# Patient Record
Sex: Female | Born: 1955 | Race: Black or African American | Hispanic: No | Marital: Married | Smoking: Never smoker
Health system: Southern US, Community
[De-identification: ages and names within clinical notes are randomized; demographics above are authoritative.]

## PROBLEM LIST (undated history)

## (undated) DIAGNOSIS — I10 Essential (primary) hypertension: Secondary | ICD-10-CM

## (undated) DIAGNOSIS — I38 Endocarditis, valve unspecified: Secondary | ICD-10-CM

## (undated) DIAGNOSIS — E119 Type 2 diabetes mellitus without complications: Secondary | ICD-10-CM

## (undated) HISTORY — PX: APPENDECTOMY: SHX54

## (undated) HISTORY — PX: BREAST LUMPECTOMY: SHX2

## (undated) HISTORY — PX: ABDOMINAL HYSTERECTOMY: SHX81

## (undated) HISTORY — PX: TUBAL LIGATION: SHX77

---

## 2009-05-14 ENCOUNTER — Observation Stay (HOSPITAL_COMMUNITY): Admission: EM | Admit: 2009-05-14 | Discharge: 2009-05-17 | Payer: Self-pay | Admitting: Emergency Medicine

## 2009-05-14 ENCOUNTER — Ambulatory Visit: Payer: Self-pay | Admitting: Cardiology

## 2009-05-15 ENCOUNTER — Encounter: Payer: Self-pay | Admitting: Cardiology

## 2011-01-13 LAB — BASIC METABOLIC PANEL
Calcium: 9.5 mg/dL (ref 8.4–10.5)
Chloride: 106 mEq/L (ref 96–112)
Creatinine, Ser: 0.91 mg/dL (ref 0.4–1.2)
GFR calc Af Amer: >60 mL/min (ref >60)
GFR calc Af Amer: >60 mL/min (ref >60)
GFR calc non Af Amer: >60 mL/min (ref >60)
GFR calc non Af Amer: >60 mL/min (ref >60)
Potassium: 3.7 mEq/L (ref 3.5–5.1)
Sodium: 139 mEq/L (ref 135–145)

## 2011-01-13 LAB — LIPID PANEL
LDL Cholesterol: 44 mg/dL (ref 0–99)
LDL Cholesterol: 50 mg/dL (ref 0–99)
Total CHOL/HDL Ratio: 2 RATIO
Triglycerides: 67 mg/dL (ref <150)
VLDL: 12 mg/dL (ref 0–40)
VLDL: 13 mg/dL (ref 0–40)

## 2011-01-13 LAB — BLOOD GAS, ARTERIAL
Acid-Base Excess: 3.5 mmol/L — ABNORMAL HIGH (ref 0.0–2.0)
pCO2 arterial: 43.8 mmHg (ref 35.0–45.0)
pO2, Arterial: 71.6 mmHg — ABNORMAL LOW (ref 80.0–100.0)

## 2011-01-13 LAB — CK TOTAL AND CKMB (NOT AT ARMC)
CK, MB: 4 ng/mL (ref 0.3–4.0)
Relative Index: 1.3 (ref 0.0–2.5)

## 2011-01-13 LAB — GLUCOSE, CAPILLARY
Glucose-Capillary: 133 mg/dL — ABNORMAL HIGH (ref 70–99)
Glucose-Capillary: 161 mg/dL — ABNORMAL HIGH (ref 70–99)
Glucose-Capillary: 162 mg/dL — ABNORMAL HIGH (ref 70–99)
Glucose-Capillary: 167 mg/dL — ABNORMAL HIGH (ref 70–99)
Glucose-Capillary: 181 mg/dL — ABNORMAL HIGH (ref 70–99)

## 2011-01-13 LAB — CARDIAC PANEL(CRET KIN+CKTOT+MB+TROPI)
CK, MB: 2.8 ng/mL (ref 0.3–4.0)
Relative Index: 1.3 (ref 0.0–2.5)
Relative Index: 1.3 (ref 0.0–2.5)
Total CK: 218 U/L — ABNORMAL HIGH (ref 7–177)
Troponin I: 0.02 ng/mL (ref 0.00–0.06)
Troponin I: 0.02 ng/mL (ref 0.00–0.06)

## 2011-01-13 LAB — HEPATIC FUNCTION PANEL
ALT: 33 U/L (ref 0–35)
AST: 21 U/L (ref 0–37)
Bilirubin, Direct: 0.2 mg/dL (ref 0.0–0.3)
Indirect Bilirubin: 0.6 mg/dL (ref 0.3–0.9)
Total Bilirubin: 0.8 mg/dL (ref 0.3–1.2)

## 2011-01-13 LAB — CBC
HCT: 31.8 % — ABNORMAL LOW (ref 36.0–46.0)
MCHC: 34.3 g/dL (ref 30.0–36.0)
MCV: 90 fL (ref 78.0–100.0)
RBC: 3.53 MIL/uL — ABNORMAL LOW (ref 3.87–5.11)
RBC: 3.88 MIL/uL (ref 3.87–5.11)
WBC: 4.9 10*3/uL (ref 4.0–10.5)
WBC: 5.9 10*3/uL (ref 4.0–10.5)

## 2011-01-13 LAB — IRON AND TIBC
Iron: 84 ug/dL (ref 42–135)
TIBC: 360 ug/dL (ref 250–470)

## 2011-01-13 LAB — POCT CARDIAC MARKERS
CKMB, poc: 1.5 ng/mL (ref 1.0–8.0)
CKMB, poc: 1.8 ng/mL (ref 1.0–8.0)
Troponin i, poc: <0.05 ng/mL (ref 0.00–0.09)
Troponin i, poc: <0.05 ng/mL (ref 0.00–0.09)

## 2011-01-13 LAB — RETICULOCYTES
Retic Count, Absolute: 80.5 10*3/uL (ref 19.0–186.0)
Retic Ct Pct: 2.3 % (ref 0.4–3.1)

## 2011-01-13 LAB — DIFFERENTIAL
Basophils Relative: 0 % (ref 0–1)
Eosinophils Absolute: 0.1 10*3/uL (ref 0.0–0.7)
Eosinophils Relative: 2 % (ref 0–5)
Lymphocytes Relative: 28 % (ref 12–46)
Lymphs Abs: 1.7 10*3/uL (ref 0.7–4.0)
Lymphs Abs: 1.8 10*3/uL (ref 0.7–4.0)
Monocytes Relative: 5 % (ref 3–12)
Monocytes Relative: 6 % (ref 3–12)
Neutro Abs: 2.6 10*3/uL (ref 1.7–7.7)
Neutrophils Relative %: 55 % (ref 43–77)

## 2011-01-13 LAB — FOLATE: Folate: 11.5 ng/mL

## 2011-01-13 LAB — FERRITIN: Ferritin: 58 ng/mL (ref 10–291)

## 2011-01-13 LAB — TSH: TSH: 0.948 u[IU]/mL (ref 0.350–4.500)

## 2011-01-13 LAB — BRAIN NATRIURETIC PEPTIDE: Pro B Natriuretic peptide (BNP): 81.2 pg/mL (ref 0.0–100.0)

## 2011-01-13 LAB — HEMOGLOBIN A1C: Hgb A1c MFr Bld: 6.6 % — ABNORMAL HIGH (ref 4.6–6.1)

## 2011-02-19 NOTE — Group Therapy Note (Signed)
NAMEOLEVA, Pamela [name]                   ACCOUNT NO.:  [id number]   MEDICAL RECORD NO.:  [id number]          PATIENT TYPE:  OBV   LOCATION:  A310                          FACILITY:  [facility]   PHYSICIAN:  Melissa L. Ladona Ridgel, MD  DATE OF BIRTH:  10/16/1955   DATE OF PROCEDURE:  05/16/2009  DATE OF DISCHARGE:                                 PROGRESS NOTE   CHIEF COMPLAINT:  Chest pain.   SUBJECTIVE:  The patient reports a small episode of dizziness  experienced immediately after arterial blood gas was drawn today, but  other than that she describes no chest pain, no headache, no other  dizziness, no palpitations.  No nausea, vomiting.   VITAL SIGNS:  Today are temperature 97.7, pulse 60, respirations 19,  blood pressure is 140/71.   She had an ABG drawn earlier that showed a pH of 7.418.  Her CBC was  reviewed, her B Met was reviewed; both were essentially within normal  limits other than a glucose of 144.   PHYSICAL EXAM:  GENERAL:  The patient is alert, oriented, seems to have  a little bit more energy today than she did yesterday.  She is overall a  55 year old African American female lying comfortably in her bed in no  acute distress.  HEENT:  Her head is normocephalic, atraumatic.  Eyes are anicteric with  pupils that are equal, round, reactive to light.  Nose shows no external  lesions.  No nasal discharge.  Mouth shows no external lesions.  She has  moist mucous membranes.  NECK:  Supple.  Her trachea is midline.  Her neck demonstrates no  lymphadenopathy or JVD.  CHEST:  No accessory muscle usage.  LUNGS:  Clear to auscultation with slightly decreased breath sounds, but  no wheezes or crackles are appreciated.  HEART:  Regular rate and rhythm with a pulse of approximately 60.  No  murmurs, rubs, or gallops or appreciated.  She has 2+ radial pulses  bilaterally.  EXTREMITIES:  Her upper extremities are warm to the touch.  Her lower  extremities demonstrate no edema.  She has 2+  dorsalis pedis pulses  bilaterally.  She has no signs of cyanosis in her distal extremities.  ABDOMEN:  Soft, nontender, nondistended with good bowel sounds.  SKIN:  Shows no signs of new bruising, lesions or rash.   ASSESSMENT:  Pamela [name] is a 55 year old African American female who is  obese, admitted with chest pain that has resolved.  The rest of her  comorbidities including diabetes, mitral regurgitation, hypertension and  normocytic anemia have been stable on home medications.  Indeed, with  regards to her anemia her anemia panel demonstrates that she is not iron  deficient.   PLAN:  Will await finalized results of the cardiac stress test and  pulmonary function tests with any further recommendations from Dr.  Dietrich Pates as well as Dr. Juanetta Gosling regarding the patient's condition.  Hopefully she will be ready for discharge tomorrow.      Stephani Police, PA      Melissa L. Ladona Ridgel, MD  Electronically Signed    MLY/MEDQ  D:  05/16/2009  T:  05/16/2009  Job:  [id number]

## 2011-02-19 NOTE — Group Therapy Note (Signed)
Pamela [name], Pamela [name]                   ACCOUNT NO.:  [id number]   MEDICAL RECORD NO.:  [id number]          PATIENT TYPE:  OBV   LOCATION:  A310                          FACILITY:  [facility]   PHYSICIAN:  Melissa L. Ladona Ridgel, MD  DATE OF BIRTH:  1956-05-09   DATE OF PROCEDURE:  05/15/2009  DATE OF DISCHARGE:                                 PROGRESS NOTE   CHIEF COMPLAINT:  Chest pain.   SUBJECTIVE:  The patient reports no chest pain currently, but she did  have some overnight.  She does report orthopnea and that she had to prop  her head up on three pillows in order to breath easier.  She is having  bowel movements and urinating without difficulty.  She is coughing, but  does not bring up any sputum.  She denies any aches or pains.   OBJECTIVE:  VITAL SIGNS:  Temperature 96.9, pulse 44, respirations 20,  blood pressure is 161/84.  EKG:  Normal sinus rhythm with bradycardia in the 40s.  She has no Q-  waves, no ST elevation.  Labs are significant today for a CK of 218,  troponin 0.02.  GENERAL:  The patient is a 55 year old African American obese female who  is sitting comfortably in bed, in no apparent distress.  HEENT:  Head is atraumatic, normocephalic.  Eyes are anicteric.  Pupils  are equal round react to light.  Conjunctivae are pink.  Nose shows no  external lesions or nasal discharge.  Mouth has moist mucous membranes  with no external lesions.  NECK:  Supple.  Trachea midline.  She has no appreciable JVD or carotid  bruits.  CHEST:  No accessory muscle usage.  LUNGS:  Decreased breath sounds, particularly on the left, but no  obvious wheezes or crackles or rales.  HEART:  Regular rate and rhythm.  She has a known history of mitral  regurgitation, but I am unable to hear this.  She has no appreciable  murmurs, rubs or gallops on my auscultation.  She has 2+ radial pulses  bilaterally and 2+ dorsalis pedis pulses bilaterally.  EXTREMITIES:  Show no edema or cyanosis.  ABDOMEN:  Soft,  nontender, nondistended with good bowel sounds.  PSYCHIATRIC:  The patient is alert, oriented and pleasant to speak with.  Her short-term memory is intact.  She displays no evidence of depression  and does not report any suicidal thoughts.   ASSESSMENT:  1. Chest pain.  2. Normocytic anemia.  3. Hypertension.  4. Mitral regurgitation.  5. Diabetes.   PLAN:  1. Chest pain, mitral regurgitation, hypertension, all being followed      by Cardiology.  They have added lisinopril today and decreased her      metoprolol per the nurse practitioner.  She will likely need a      cardiac cath.  2. For normocytic anemia.  There are no frank signs of bleeding.  We      will check guaiacs.  3. Type 2 diabetes.  Stable, currently on Actos and insulin.  4. Arthritis.  The patient stable on Skelaxin.  Addendum:  I have seen and evaluated this patient .  I concur with the  above physical exam.  I reveiwed the patient's CT scan with the patient.  There was no evidence for PE or pneumonia.  She does have a small left  pleural effusion of unclear significance.  I have reviewed the case with  Dr. Dietrich Pates and the patient will be taken for stress testing in the AM.  Melisa L. Ladona Ridgel, MD      Stephani Police, PA      Melissa L. Ladona Ridgel, MD  Electronically Signed    MLY/MEDQ  D:  05/15/2009  T:  05/15/2009  Job:  [id number]

## 2011-02-19 NOTE — H&P (Signed)
[name], [name] NO.:  [id number]   MEDICAL RECORD NO.:  [id number]          PATIENT TYPE:  EMS   LOCATION:  ED                            FACILITY:  [facility]   PHYSICIAN:  Pamela Shipper, MD     DATE OF BIRTH:  [birth year]   DATE OF ADMISSION:  05/14/2009  DATE OF DISCHARGE:  LH                              HISTORY & PHYSICAL   The patient's PMD is Dr. Renaldo [name] in [place], [place].  Her  cardiologist is Dr. Earna [name].   ADMISSION DIAGNOSES:  1. Chest pain.  2. History of type 2 diabetes.  3. Hypertension.  4. Mitral regurgitation.  5. Arthritis.  6. History of stroke.   CHIEF COMPLAINT:  Chest pain since this morning.   HISTORY OF PRESENT ILLNESS:  The patient is a 55 year old African  American female who has a past medical history of diabetes, hypertension  who was walking across the parking lot of her church when she started  experiencing pain in the center part of the chest.  Pain was described  as sharp pain associated with shortness of breath, dizziness.  She felt  like her heart was beating fast.  She denies any nausea.  She started  sweating profusely denies any syncopal episode.  Denies radiation of the  pain.  She rested but it made the pain no better.  The pain seemed to  increase with deep breathing.  Pain was 8/10 in intensity.  She took one  of her sublingual nitroglycerin and experienced some relief of the pain.  Then EMS gave her another sublingual nitroglycerin and she had some more  relief.  However, the pain has not gone away completely.  The pain is  currently about 5/10 intensity.  She had similar pain back in July of  this year when she was climbing stairs and that pain resolved  spontaneously.  She tells me that she has had at least two stress tests,  the last one was done about a year ago which was negative.  She has  never had a cardiac cath.  No other aggravating or relieving factors  identified.  No other precipitant factors  identified.   MEDICATIONS AT HOME:  1. Meclizine 25 mg three times a day.  2. Actos 30 mg daily.  3. Simvastatin 40 mg once daily.  4. Benicar HCT 40/25 once daily.  5. Metoprolol (I think this is extended release) 200 mg once daily.  6. Metformin 1000 mg every morning and 500 mg at bedtime.  7. Aspirin 81 mg daily.  8. Metaxalone 800 mg q.6 h. as needed.  9. Nitroglycerin sublingually as needed.  10.Zantac as needed.   ALLERGIES:  Penicillin; reaction is unknown.   PAST MEDICAL HISTORY:  Positive for stroke in 2007 which affected the  left side of her face, history of diabetes, mitral regurgitation,  hypertension, arthritis in her knee and arm.   SURGICAL HISTORY:  Includes appendectomy, benign breast lump removal.   SOCIAL HISTORY:  Lives in [place] with her husband.  She works in  Public affairs consultant.  Denies smoking, alcohol, or illicit drug use.  Independent with daily activities otherwise.   FAMILY HISTORY:  Positive for diabetes, arthritis.  No history of heart  disease.   REVIEW OF SYSTEMS:  GENERAL:  System positive for weakness.  HEENT:  Unremarkable.  CARDIOVASCULAR:  As in HPI.  RESPIRATORY:  As in HPI.  Denies any fever or cough.  GI:  Unremarkable.  GU:  Unremarkable.  NEUROLOGICAL:  Unremarkable.  PSYCHIATRIC:  Unremarkable.  DERMATOLOGICAL:  Unremarkable.  MUSCULOSKELETAL:  Unremarkable. OTHER  SYSTEMS:  Unremarkable.   PHYSICAL EXAMINATION:  VITAL SIGNS:  Temperature has not been recorded.  Blood pressure was initially 164/91, last reading is 156/81, heart rate  in the 40s occasionally, respiratory rate 20 breaths per minute,  saturation 100% on 2 L nasal cannula.  GENERAL:  Exam is obese African American female in no distress.  HEENT:  Head is normocephalic, atraumatic.  Oral mucous membranes moist.  No pallor, no icterus.  NECK:  Soft and supple.  No thyromegaly is appreciated.  No  lymphadenopathy is noted.  LUNGS:  Clear to auscultation  bilaterally.  No wheezing, rales, or  rhonchi.  No dullness to percussion.  CARDIOVASCULAR SYSTEM:  S1, S2 normal, regular.  I do not appreciate any  murmurs.  No S3, S4.  No rubs or bruits.  Slight pedal edema, not  significant.  Peripheral pulses are poor but palpable.  ABDOMEN:  Obese, nontender, nondistended.  Bowel sounds are present.  No  masses or organomegaly is appreciated.  GU:  Examination deferred at this time.  MUSCULOSKELETAL:  Exam no obvious joint swelling is noted.  Legs appear  to be enlarged, however, it could be her body habitus.  NEUROLOGICALLY:  She is alert, oriented x3.  No focal neurological  deficits are present.  DERMATOLOGICAL:  Exam does not reveal any rashes.   LAB DATA:  Her CBC shows a hemoglobin of 10.6, MCV 90, white cell count  is normal, platelet count is normal.  D-dimer is 0.58.  Glucose 137, BUN  and creatinine are normal.  Cardiac enzymes negative x2.  EKG shows a  sinus rhythm at a rate of 68, normal axis, intervals appear to be in the  normal range.  No definite Q waves are present.  No concerning ST- or T-  wave changes are noted.   Chest x-ray was done which shows borderline heart size with pulmonary  vascular congestion without any acute changes otherwise.   ASSESSMENT:  This is a 55 year old African American female who presents  with chest pain in the retrosternal area described as a sharp pain  increasing with deep breathing.  Her D-dimer is mildly elevated.  Differential diagnosis includes pulmonary embolism, coronary artery  disease, gastroesophageal reflux disease.  She does have risk factors  for CAD including diabetes, hypertension, and obesity.  1. Chest pain.  We have been unable to get a CT because her IV access      is very poor.  We will obtain a PICC line tomorrow and get a CT      angio at that time.  EKG will be ,repeated lipid panel will be      checked.  Cardiac enzymes will be cycled.  Cardiology consultation      will be  obtained.  2. Anemia.  We will check anemia panel.  3. Diabetes.  Continue with Actos, hold off on metformin for now.      Sliding scale will be provided.  HbA1c will be  checked.  4. Hypertension.  Continue with Benicar.  Her heart rate here      occasionally is in the 40s.  I will hold metoprolol today and start      her tomorrow on a b.i.d. dosing.  5. Obesity.  Weight loss counseling provided.  6. Venous Doppler of the lower extremities will be checked.  7. Records will be obtained from Dr. Albertina Senegal office.  8. LFTs will be checked as well.   Further management decisions will depend on results of further testing  and patient's response to treatment.      Pamela Shipper, MD  Electronically Signed     GK/MEDQ  D:  05/14/2009  T:  05/14/2009  Job:  [id number]   cc:   Dr. Clent Ridges, [place]   Dr. Renaldo [name]  [place], [place]

## 2011-02-19 NOTE — Consult Note (Signed)
NAMESKYLEY, GRANDMAISON NO.:  [id number]   MEDICAL RECORD NO.:  [id number]          PATIENT TYPE:  OBV   LOCATION:  A310                          FACILITY:  [facility]   PHYSICIAN:  Gerrit Friends. Dietrich Pates, MD, FACCDATE OF BIRTH:  23-Sep-1956   DATE OF CONSULTATION:  05/15/2009  DATE OF DISCHARGE:                                 CONSULTATION   PRIMARY CARDIOLOGIST:  Dr. Earna Coder in [place].   PRIMARY CARE PHYSICIAN:  Dr. Renaldo Harrison in [place]   REASON FOR CONSULTATION:  Chest pain.   HISTORY OF PRESENT ILLNESS:  A 55 year old, obese African American  female with known history of diabetes, mitral regurgitation,  hypertension, and CVA, who normally sees cardiologist in [place], Dr.  Earna Coder (the patient last saw him in March 2010.)  The patient is being  treated for mitral valve regurgitation.  She did have a stress test and  echocardiogram little over a year ago.  Records have been requested.   The patient will on her way to church while driving noticed some  tightness in her chest and shortness of breath on arriving to church and  walking from the parking lot to inside the church.  She had some dyspnea  on exertion, wheezing, and began to have some chest pain which she  described as sharp, constant, and midsternal with some radiation to the  left shoulder, however, she is complaining of some left shoulder  arthritis last week.  The patient states that the pain lasts  approximately 1 hour while sitting in church, the shortness of breath  continues with some chest discomfort.  Her son, who was playing in  instrument in the church prior to the service noticed her gasping for  breath and she began to feel dizzy.  He brought her to [facility]  emergency room.   The patient is a Tree surgeon at [employer]  and she states approximately 3 weeks ago while getting her TB test  completed and seeing the hospital nurse there.  She was having some  shortness of breath that was noticed by the hospital nurse.  The patient  admitted having some shortness of breath which has been progressive over  the last 3 weeks.  Occasional chest tightness.  She did not seek medical  attention for this.  The patient is caretaker for 2 elderly parents and  has been focusing on their care and has been diminishing her own.   On arrival to [facility] emergency room, the patient was given 4 baby  aspirin, Zofran, and nitroglycerin and felt some better.  Her blood  pressure on arrival was found to be elevated at 164/91 with a heart rate  of 61.  The patient was placed on oxygen and her saturation was 97%.  Review of oxygen sat off oxygen reveals an O2 sat of 89% on room air.  Currently, the patient is without complaints and resting comfortably in  bed without oxygen.  She is not short of breath and not complaining of  discomfort in her chest at this time.   REVIEW  OF SYSTEMS:  Vertigo, chest pain, shortness of breath, dyspnea on  exertion, lower extremity edema, right knee and shoulder and back pain,  right shoulder pain.  She has been ongoing and she is in physical  therapy for this.  All other systems are reviewed and found to be  negative.   PAST MEDICAL HISTORY:  Type 2 diabetes, hypertension, mitral  regurgitation, arthritis of the right knee and right arm, CVA in 2007,  and obesity.   PAST SURGICAL HISTORY:  Appendectomy and benign breast lump removal.   SOCIAL HISTORY:  She lives in [place] with her husband.  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  She is  married.  She does not smoke, does not drink alcohol, does not use  illicit drugs.   FAMILY HISTORY:  Arthritis, hypertension, and cholesterol in her mother  and father with Alzheimer, and hypertension.  She has a sister with  hypertension.   CURRENT MEDICATIONS AT HOME:  1. Meclizine 25 mg t.i.d.  2. Actos 30 mg daily.  3. Simvastatin 40 mg x1 daily.   4. Benicar/HCTZ 40/25 mg once daily.  5. Metoprolol 200 mg once daily.  6. Metformin 1000 mg in the morning and metformin 500 mg in the      evening.  7. Aspirin 81 mg daily.  8. Metaxalone 800 mg 4 times a day p.r.n.  9. Nitroglycerin 0.4 mg p.r.n. pain.  10.Zantac 150 mg p.r.n.   ALLERGIES:  PENICILLIN.   CURRENT MEDICATIONS DURING HOSPITALIZATION:  1. Insulin per sliding scale, blood pressure 100 mg b.i.d.  2. Meclizine 25 mg daily.  3. Actos 30 mg daily.  4. Simvastatin 40 mg nightly.  5. Benicar 40 mg daily.  6. HCTZ 25 mg daily.  7. Aspirin 81 mg daily.  8. Low-molecular weight heparin 110 mg b.i.d.   STATUS:  The patient is a full code.   CURRENT LABORATORIES:  Sodium 139, potassium 3.7, chloride 106, CO2 30,  BUN 13, creatinine 0.79, glucose 137, total bili 0.8, alkaline  phosphatase 42, AST 21, ALT 33, albumin 3.3, D-dimer 0.58, BNP 81.2,  troponin less than 0.05, 0.05, 0.02, and 0.02 respectively.  CK 91 and  218 on the last 2 stats.  EKG revealing sinus bradycardia rate of 44  beats per minute.  Chest x-ray borderline heart size with pulmonary  vascular congestion, cardiovascular risk factors, hypertension,  diabetes, cholesterol, obesity, and history of CVA.   PHYSICAL EXAMINATION:  VITAL SIGNS:  Blood pressure 161/84, pulse 44,  respirations 20, temperature 96.9, O2 sat 89% on room air, weight of 114  kg.  GENERAL:  She is awake, alert, oriented, in no acute distress.  HEENT:  Head is normocephalic and atraumatic.  Eyes, PERRLA.  The  patient wears glasses.  NECK:  Supple.  There is no JVD.  No carotid bruits appreciated at this  time.  No HJR.  Neck is obese.  CARDIOVASCULAR:  Regular rate and rhythm with 1/6 systolic murmur  auscultated without rubs or gallops.  Pulses are 2+ and equal without  bruits.  LUNGS:  Diminished breath sounds bibasilar.  There is no wheezes or  rhonchi at this time.  SKIN:  Warm and dry.  ABDOMEN:  Obese, nontender with 2+ bowel  sounds.  EXTREMITIES:  Without clubbing or cyanosis.  There is mild 1+ edema  pretibially.  MUSCULOSKELETAL:  There is some weakness on the right side with pain in  the right shoulder chronically with movement.  NEURO:  Cranial nerves II  through XII are grossly intact.  There is diminished feeling and  movement on the right side and left side has some facial droop noted.   IMPRESSION:  1. Chest pain concerning for angina.  The patient with multiple      cardiovascular risk factors.  Negative cardiac enzymes and EKG.  2. Hypertension not well controlled on current medications of Benicar      and HCTZ.  3. Bradycardia on Lopressor 100 mg b.i.d. and will decrease dose to      avoid this.  4. Diabetes, per primary care.   PLAN:  A 55 year old obese African American female with multiple  cardiovascular risk factors admitted with increasing shortness of  breath, dyspnea on exertion, and chest pain symptoms are concerning for  angina versus fluid overload.  Chest x-ray pulse showed pulmonary  vascular congestion.  We will get an echo for LV function and compared  to past echo which has been requested from [facility] cardiologist, Dr.  Earna Coder.  Chest x-ray showed pulmonary vascular congestion.  We will  give 1 dose of IV Lasix and monitor output.  The patient should be  considered for cardiac catheterization if change in LV function is noted  or symptoms persist.  She does have multiple cardiovascular risk factors  and it would be prudent to have further cardiac testing in the setting.  We will evaluate further through hospital course making further  recommendations.   On behalf of the physicians providers of [facility],  we would like to thank [facility], Dr. Renaldo Harrison  and Dr. Earna Coder in [place] for allowing [place] to participate in the care  of this patient.      Bettey Mare. Lyman Bishop, NP      Gerrit Friends. Dietrich Pates, MD, [facility]  Electronically  Signed    KML/MEDQ  D:  05/15/2009  T:  05/16/2009  Job:  [id number]   cc:   Antoine Primas, DO   Renaldo Harrison

## 2011-02-19 NOTE — Procedures (Signed)
[name], Pamela [name] NO.:  [id number]   MEDICAL RECORD NO.:  [id number]          PATIENT TYPE:  OBV   LOCATION:  A310                          FACILITY:  [facility]   PHYSICIAN:  Edward L. Juanetta Gosling, M.D.DATE OF BIRTH:  03-06-1956   DATE OF PROCEDURE:  DATE OF DISCHARGE:                            PULMONARY FUNCTION TEST   1. Spirometry shows above normal capacities.  There is no evidence of      airflow obstruction.  2. Lung volumes also are above normal, above predicted.  3. DLCO is moderately reduced.  4. Arterial blood gas is normal.      Edward L. Juanetta Gosling, M.D.  Electronically Signed     ELH/MEDQ  D:  05/17/2009  T:  05/17/2009  Job:  [id number]   cc:   Gerrit Friends. Dietrich Pates, MD, [facility]  [address]  [place], [place] [address]

## 2011-02-19 NOTE — Discharge Summary (Signed)
Pamela [name], [name]                   ACCOUNT NO.:  [id number]   MEDICAL RECORD NO.:  [id number]          PATIENT TYPE:  OBV   LOCATION:  A310                          FACILITY:  [facility]   PHYSICIAN:  Melissa L. Ladona Ridgel, MD  DATE OF BIRTH:  [birth year]   DATE OF ADMISSION:  05/14/2009  DATE OF DISCHARGE:  08/11/2010LH                               DISCHARGE SUMMARY   DISCHARGE DIAGNOSES:  1. Noncardiac chest pain.  The patient was seen and evaluated by      cardiology.  She underwent stress testing and was found to have a      negative stress test.  Follow-up will be arranged by Dr. Dietrich Pates      for further evaluation and monitoring of her hypertension.      Adjustments to her medications were made including decreasing her      metoprolol to 50 twice a day and adding clonidine 0.1 mg twice      daily.  The patient has had no subsequent chest pain during the      course of the rest of the hospital stay and on the day of discharge      is pain free.  2. Diabetes reasonably controlled.  The patient has had to be off of      her metformin secondary to having a CT scan of the chest on May 15, 2009.  She is currently 48 hours out of that study and can      resume her metformin today.  She can follow up with her primary      care physician with regard to her diabetes management.  3. Bradycardia on Lopressor.  As stated, her Lopressor has been      decreased to 50 twice daily to avoid continued bradycardia.  4. Interstitial prominence on chest x-ray.  The patient underwent      pulmonary function testing which showed normal spirometry, no      evidence for airflow obstruction per the official reading and her      lung volumes appeared normal with a DLCO moderately reduced.  The      arterial blood gas was normal.  The patient does have periods of      snoring when she sleeps.  I may recommend that she have a sleep      study as an outpatient if her primary care physician feels that  this is an appropriate course of action for her.  She can be      referred to Dr. Juanetta Gosling who has done the official reading on her      PFTs.  5. History of arthritis.  She will continue with her current home      medications which include as needed Skelaxin and Tylenol.  6. The patient does have a history of stroke and is currently on      aspirin.  No current signs or symptoms are present for recurrence.   MEDICATIONS AT THE TIME OF DISCHARGE:  1. Meclizine 25 mg three times daily.  2.  Actos 30 mg once daily.  3. Simvastatin 40 mg once daily.  4. Benicar/hydrochlorothiazide once daily.  5. Metoprolol has been decreased to 50 mg twice daily.  6. Metformin 1000 mg every morning with the addition of 500 mg every      morning.  7. Aspirin 81 mg.  8. Skelaxin 800 mg 4 times daily as needed.  9. Nitroglycerin 0.4 mg as needed.  10.Zantac as needed.  11.The patient had the addition of clonidine 0.1 mg b.i.d. and has      tolerated that well without any significant bradycardia or      hypotension.   CONSULTATIONS:  Gerrit Friends. Dietrich Pates, MD, [facility] who stated that he would  have his office call if she needed to have any further follow-up.   TESTING IN HOSPITAL:  CT angio of the chest was negative for pulmonary  embolus.  She had a very slight right pleural effusion and some  atelectasis, but otherwise no evidence for infection.  The patient had  Doppler ultrasounds of the lower extremities which showed no evidence  for clot.  A 2 view chest x-ray showed normal mediastinal contours with  slight pulmonary vascular congestion, but no obvious failure or  consolidation.   The patient also had a stress test completed by Dr. Dietrich Pates.  The  official reading is not available, however a verbal report from Dr.  Dietrich Pates stated that she had a negative stress study.   HOSPITAL COURSE:  The patient is a very pleasant 55 year old African  American female with a known history of hypertension, stroke  and  diabetes who presented to the emergency room with chest pain while  walking across the parking lot of her church.  She states that it was  located in the center of her chest, was sharp and associated with  shortness of breath and a little bit of dizziness.  She felt like her  heart was beating fast.  She denied any nausea, but stated that she  developed sweating and did not have any presyncope or syncope.  She  described the pain as 8 out of 10 for which she took a sublingual nitro  and experienced relief.  EMS gave her a second nitro and she was brought  to the emergency room for further care.  In the emergency room her pain  was 5/10.  The patient reported a similar event back in July. she  therefore was admitted for further evaluation for possible testing with  cardiac catheterization.  The patient was admitted to the hospital  telemetry floor.  She was started on enoxaparin at 1 mg/kg q.12 hours.  She was treated with one dose of Lasix and was given aspirin.  She was  scheduled for serial cardiac markers all of which were negative.  Her  EKG in the emergency room showed sinus rhythm, rate of 68, normal access  without any Q-waves or evidence for acute ST elevations.  Because the  patient's D-dimer was elevated, a CT scan of the chest was recommended.  Once IV access was obtained, the patient was taken to CT and showed no  obvious pulmonary embolus or other worrisome cardiac findings.  In light  of the fact that the patient responded favorably to treatment with  nitroglycerin and has increased risk factors, the patient did have a  cardiac stress test during the hospital stay which according to the  preliminary reading by Dr. Dietrich Pates was negative.  On the day of  discharge the patient has  been tolerating changes to her medication  which included decrease of metoprolol and the addition of clonidine.  Vital signs:  Temperature is 98, blood pressure 145/70, pulse 64,  respirations  20, saturation 96%.  General:  This is a moderately obese  African American female in no acute distress.  She is normocephalic,  atraumatic.  Pupils equal, round, reactive to light.  Extraocular  muscles are intact.  Mucous membranes are moist.  Neck is supple.  There  is no JVD, no lymph nodes, no carotid bruits.  Chest is decreased, but  clear to auscultation.  There are no rhonchi, rales, wheezes.  Cardiovascular:  Distant heart sounds.  I do not appreciate a murmur,  although she does have a history of mitral regurgitation and has had  detected 1/6 systolic murmur on previous evaluations.  Abdomen is obese,  nontender, nondistended with positive bowel sounds.  There is no  hepatosplenomegaly, no guarding.  Extremities showed no clubbing,  cyanosis and I do not appreciate any pretibial edema which was  appreciated prior.  Musculoskeletal:  I am not perceiving any deficits.  Cranial nerves II-XII appear to be intact.  Power is 5/5.  There is some  facial asymmetry, but I would not call this droop.  Psychiatrically  affect is appropriate.  Recent and remote memory are intact.  Judgment  and insight are intact.   PERTINENT LABORATORIES:  Cholesterol 121, triglyceride 67, HDL 58, LDL  50.  Arterial blood gas done with her PFTs showed a pH of 7.418, pCO2  43.8, pO2 71.6, bicarb of 25.  Hemoglobin A1c is 6.6.  Sodium is 140,  potassium 3.7, chloride 105, CO2 30, BUN is 11, creatinine 0.9 and  glucose of 144.  Her white count of 4.9, hemoglobin 11.9, hematocrit  34.8 and platelets of 208.  Her total iron and anemia profile was within  normal limits with an ever so slight decrease in saturation, but a  normal total iron and iron binding capacity.  Her TSH was 0.948.  Folic  acid was 11.5.  B12 was 235.  Cardiac markers were all negative.  Her  reticulocyte count was appropriate.  Her liver enzymes were within  normal limits.   ASSESSMENT/PLAN:  The patient is stable for discharge to home  with  follow-up as dictated by Dr. Dietrich Pates.  His office will call to arrange  any follow-up appointments that are necessary.  The patient should  follow up with her primary care physician in the next 30 years so days  are as needed for evaluation of her diabetes.  She is able to resume her  metformin today which is held secondary to a CT scan of the chest.   CONDITION AT DISCHARGE:  Stable.   DIET:  Low carbohydrates, low cholesterol.   Please note that I have filled out FMLA paperwork for the time covering  her hospital stay and have advised the patient if she will be out for  any other reasons other than the current hospitalization she will need  to see her primary care physician for further papers.  At this time I  have placed no restrictions on her return to work.   Total time of this discharge is less than 30 minutes.      Melissa L. Ladona Ridgel, MD  Electronically Signed     MLT/MEDQ  D:  05/17/2009  T:  05/17/2009  Job:  [id number]

## 2012-10-01 ENCOUNTER — Emergency Department (HOSPITAL_COMMUNITY)
Admission: EM | Admit: 2012-10-01 | Discharge: 2012-10-01 | Disposition: A | Discharge status: Home / Self Care | Payer: BC Managed Care – PPO | Attending: Emergency Medicine | Admitting: Emergency Medicine

## 2012-10-01 ENCOUNTER — Encounter (HOSPITAL_COMMUNITY): Payer: Self-pay

## 2012-10-01 DIAGNOSIS — I10 Essential (primary) hypertension: Secondary | ICD-10-CM | POA: Insufficient documentation

## 2012-10-01 DIAGNOSIS — Z7982 Long term (current) use of aspirin: Secondary | ICD-10-CM | POA: Insufficient documentation

## 2012-10-01 DIAGNOSIS — Z8679 Personal history of other diseases of the circulatory system: Secondary | ICD-10-CM | POA: Insufficient documentation

## 2012-10-01 DIAGNOSIS — N390 Urinary tract infection, site not specified: Secondary | ICD-10-CM

## 2012-10-01 DIAGNOSIS — E119 Type 2 diabetes mellitus without complications: Secondary | ICD-10-CM | POA: Insufficient documentation

## 2012-10-01 DIAGNOSIS — A599 Trichomoniasis, unspecified: Secondary | ICD-10-CM

## 2012-10-01 DIAGNOSIS — Z79899 Other long term (current) drug therapy: Secondary | ICD-10-CM | POA: Insufficient documentation

## 2012-10-01 DIAGNOSIS — M545 Low back pain, unspecified: Secondary | ICD-10-CM | POA: Insufficient documentation

## 2012-10-01 DIAGNOSIS — M549 Dorsalgia, unspecified: Secondary | ICD-10-CM

## 2012-10-01 HISTORY — DX: Endocarditis, valve unspecified: I38

## 2012-10-01 HISTORY — DX: Essential (primary) hypertension: I10

## 2012-10-01 HISTORY — DX: Type 2 diabetes mellitus without complications: E11.9

## 2012-10-01 LAB — URINALYSIS, ROUTINE W REFLEX MICROSCOPIC
Glucose, UA: >1000 mg/dL — AB
Specific Gravity, Urine: 1.01 (ref 1.005–1.030)
pH: 6 (ref 5.0–8.0)

## 2012-10-01 LAB — URINE MICROSCOPIC-ADD ON

## 2012-10-01 LAB — GLUCOSE, CAPILLARY: Glucose-Capillary: 350 mg/dL — ABNORMAL HIGH (ref 70–99)

## 2012-10-01 MED ORDER — METRONIDAZOLE 500 MG PO TABS: 500.0000 mg | ORAL_TABLET | Freq: Two times a day (BID) | ORAL | Status: DC

## 2012-10-01 MED ORDER — KETOROLAC TROMETHAMINE 60 MG/2ML IM SOLN
60.0000 mg | Freq: Once | INTRAMUSCULAR | Status: AC
  Administered 2012-10-01: 60 mg via INTRAMUSCULAR
  Filled 2012-10-01: qty 2

## 2012-10-01 MED ORDER — NITROFURANTOIN MONOHYD MACRO 100 MG PO CAPS: 100.0000 mg | ORAL_CAPSULE | Freq: Two times a day (BID) | ORAL | Status: DC

## 2012-10-01 MED ORDER — HYDROCODONE-ACETAMINOPHEN 5-325 MG PO TABS: 1.0000 | ORAL_TABLET | ORAL | Status: AC | PRN

## 2012-10-01 NOTE — ED Notes (Signed)
Pt reports back pain for several days, denies any known injury.

## 2012-10-01 NOTE — ED Notes (Signed)
Lower back pain worse with movement. Denies any gi/gu symptoms

## 2012-10-03 LAB — URINE CULTURE: Colony Count: >=100000

## 2012-10-03 NOTE — ED Provider Notes (Signed)
History     CSN: [id number]  Arrival date & time 10/01/12  7829   First MD Initiated Contact with Patient 10/01/12 [phone]      Chief Complaint  Patient presents with  . Back Pain    (Consider location/radiation/quality/duration/timing/severity/associated sxs/prior treatment) HPI Comments: Pamela [name]  Presents with worsened low back pain for the past several days.  She has had back pain without obvious injury the past several months,  Which is worse today.  She is being evaluated for this by her pcp in [place],  In fact, has had recent xrays which she has not heard results from yet.    Patient denies any new injury specifically. Pain is worse with movement such as bending and twisting and walking.  There is no radiation into the  lower extremities.  There has been no weakness or numbness in the lower extremities and no urinary or bowel retention or incontinence, although she states her urine has been cloudy lately. She denies abdominal pain and denies fevers or chills.  Patient does not have a history of cancer or IVDU.   The history is provided by the patient.    Past Medical History  Diagnosis Date  . Diabetes   . Hypertension   . Heart valve problem     Past Surgical History  Procedure Date  . Appendectomy   . Tubal ligation   . Breast lumpectomy     No family history on file.  History  Substance Use Topics  . Smoking status: Never Smoker   . Smokeless tobacco: Not on file  . Alcohol Use: No    OB History    Grav Para Term Preterm Abortions TAB SAB Ect Mult Living                  Review of Systems  Constitutional: Negative for fever.  Respiratory: Negative for shortness of breath.   Cardiovascular: Negative for chest pain and leg swelling.  Gastrointestinal: Negative for abdominal pain, constipation and abdominal distention.  Genitourinary: Negative for dysuria, urgency, frequency, flank pain and difficulty urinating.  Musculoskeletal: Positive for back  pain. Negative for joint swelling and gait problem.  Skin: Negative for rash.  Neurological: Negative for weakness and numbness.    Allergies  Aspirin; Bee venom; and Penicillins  Home Medications   Current Outpatient Rx  Name  Route  Sig  Dispense  Refill  . AMLODIPINE BESYLATE 10 MG PO TABS   Oral   Take 10 mg by mouth every morning.         . ASPIRIN EC 81 MG PO TBEC   Oral   Take 81 mg by mouth at bedtime.         . CYCLOBENZAPRINE HCL 5 MG PO TABS   Oral   Take 5 mg by mouth 3 (three) times daily as needed. For muscle pain         . HYDROCHLOROTHIAZIDE 25 MG PO TABS   Oral   Take 25 mg by mouth every morning.         Marland Kitchen LISINOPRIL 10 MG PO TABS   Oral   Take 10 mg by mouth every morning.         Marland Kitchen METFORMIN HCL 500 MG PO TABS   Oral   Take 500 mg by mouth 2 (two) times daily with a meal.         . METOPROLOL TARTRATE 100 MG PO TABS   Oral   Take 100  mg by mouth every morning.         Marland Kitchen NITROGLYCERIN 0.4 MG SL SUBL   Sublingual   Place 0.4 mg under the tongue every 5 (five) minutes x 3 doses as needed. For chest pain         . PRAVASTATIN SODIUM 40 MG PO TABS   Oral   Take 40 mg by mouth at bedtime.         Marland Kitchen RANITIDINE HCL 150 MG PO TABS   Oral   Take 150 mg by mouth daily as needed. For heart burn and indigestion         . TRAMADOL HCL 50 MG PO TABS   Oral   Take 50 mg by mouth every 6 (six) hours as needed. For pain         . HYDROCODONE-ACETAMINOPHEN 5-325 MG PO TABS   Oral   Take 1 tablet by mouth every 4 (four) hours as needed for pain.   15 tablet   0   . METRONIDAZOLE 500 MG PO TABS   Oral   Take 1 tablet (500 mg total) by mouth 2 (two) times daily.   14 tablet   0   . NITROFURANTOIN MONOHYD MACRO 100 MG PO CAPS   Oral   Take 1 capsule (100 mg total) by mouth 2 (two) times daily.   10 capsule   0     BP 170/82  Pulse 119  Temp 98 F (36.7 C) (Oral)  Resp 16  Ht 5\' 5"  (1.651 m)  Wt 243 lb (110.224 kg)   BMI 40.44 kg/m2  SpO2 98%  Physical Exam  Nursing note and vitals reviewed. Constitutional: She appears well-developed and well-nourished.       Triage vs reviewed.  Pulse 88 during exam.  HENT:  Head: Normocephalic.  Eyes: Conjunctivae normal are normal.  Neck: Normal range of motion. Neck supple.  Cardiovascular: Normal rate and intact distal pulses.        Pedal pulses normal.  No edema.    Pulmonary/Chest: Effort normal.  Abdominal: Soft. Bowel sounds are normal. She exhibits no distension and no mass.  Musculoskeletal: Normal range of motion. She exhibits no edema.       Lumbar back: She exhibits tenderness. She exhibits no swelling, no edema and no spasm.  Neurological: She is alert. She has normal strength. She displays no atrophy and no tremor. No sensory deficit. Gait normal.  Reflex Scores:      Patellar reflexes are 2+ on the right side and 2+ on the left side.      Achilles reflexes are 2+ on the right side and 2+ on the left side.      No strength deficit noted in hip and knee flexor and extensor muscle groups.  Ankle flexion and extension intact.  Skin: Skin is warm and dry.  Psychiatric: She has a normal mood and affect.    ED Course  Procedures (including critical care time)  Labs Reviewed  URINALYSIS, ROUTINE W REFLEX MICROSCOPIC - Abnormal; Notable for the following:    APPearance CLOUDY (*)     Glucose, UA >1000 (*)     Hgb urine dipstick TRACE (*)     Ketones, ur 15 (*)     Nitrite POSITIVE (*)     Leukocytes, UA SMALL (*)     All other components within normal limits  URINE MICROSCOPIC-ADD ON - Abnormal; Notable for the following:    Squamous Epithelial / LPF MANY (*)  Bacteria, UA MANY (*)     All other components within normal limits  GLUCOSE, CAPILLARY - Abnormal; Notable for the following:    Glucose-Capillary 350 (*)     All other components within normal limits  URINE CULTURE   No results found.   1. UTI (urinary tract infection)   2.  Trichomonal infection   3. Back pain       MDM  Labs reviewed.  Xray results obtained from [facility] dated 09/17/12 - (studies done for disability eval) - LS mild degenerative joint disease only.  No neuro deficit on exam or by history to suggest emergent or surgical presentation.  Also discussed worsened sx that should prompt immediate re-evaluation including distal weakness, bowel/bladder retention/incontinence.  Pt was prescribed hydrocodone for her lumbar pain.  She was also prescribed macrobid and flagyl for her urinary infection,  Advised to get office visit with pcp after abx completed for repeat Ua to ensure infection resolved.  Discussed blood glucose, pt states her numbers have been high. She also has not taken her meds today.  Encouraged to do this once home and f/u with pcp in [place].  Patients labs and/or radiological studies were reviewed during the medical decision making and disposition process.             Burgess Amor, [place] 10/03/12 [phone]

## 2012-10-04 NOTE — ED Notes (Signed)
+  Urine. Patient treated with Macrobid. Sensitive to same. Per protocol MD. °

## 2012-10-04 NOTE — ED Provider Notes (Signed)
Medical screening examination/treatment/procedure(s) were performed by non-physician practitioner and as supervising physician I was immediately available for consultation/collaboration.   Benny Lennert, MD 10/04/12 [phone]

## 2015-03-20 ENCOUNTER — Emergency Department (HOSPITAL_COMMUNITY): Payer: BLUE CROSS/BLUE SHIELD

## 2015-03-20 ENCOUNTER — Encounter (HOSPITAL_COMMUNITY): Payer: Self-pay | Admitting: *Deleted

## 2015-03-20 ENCOUNTER — Emergency Department (HOSPITAL_COMMUNITY)
Admission: EM | Admit: 2015-03-20 | Discharge: 2015-03-20 | Disposition: A | Discharge status: Home / Self Care | Payer: BLUE CROSS/BLUE SHIELD | Attending: Emergency Medicine | Admitting: Emergency Medicine

## 2015-03-20 DIAGNOSIS — R600 Localized edema: Secondary | ICD-10-CM | POA: Diagnosis not present

## 2015-03-20 DIAGNOSIS — Z7982 Long term (current) use of aspirin: Secondary | ICD-10-CM | POA: Diagnosis not present

## 2015-03-20 DIAGNOSIS — Z79899 Other long term (current) drug therapy: Secondary | ICD-10-CM | POA: Insufficient documentation

## 2015-03-20 DIAGNOSIS — M25562 Pain in left knee: Secondary | ICD-10-CM | POA: Insufficient documentation

## 2015-03-20 DIAGNOSIS — Z792 Long term (current) use of antibiotics: Secondary | ICD-10-CM | POA: Insufficient documentation

## 2015-03-20 DIAGNOSIS — E119 Type 2 diabetes mellitus without complications: Secondary | ICD-10-CM | POA: Insufficient documentation

## 2015-03-20 DIAGNOSIS — I10 Essential (primary) hypertension: Secondary | ICD-10-CM | POA: Diagnosis not present

## 2015-03-20 DIAGNOSIS — R609 Edema, unspecified: Secondary | ICD-10-CM

## 2015-03-20 DIAGNOSIS — Z880 Allergy status to penicillin: Secondary | ICD-10-CM | POA: Insufficient documentation

## 2015-03-20 MED ORDER — OXYCODONE-ACETAMINOPHEN 5-325 MG PO TABS: 1.0000 | ORAL_TABLET | ORAL | Status: DC | PRN

## 2015-03-20 MED ORDER — OXYCODONE-ACETAMINOPHEN 5-325 MG PO TABS
1.0000 | ORAL_TABLET | Freq: Once | ORAL | Status: AC
  Administered 2015-03-20: 1 via ORAL
  Filled 2015-03-20: qty 1

## 2015-03-20 NOTE — Discharge Instructions (Signed)
Knee Pain Knee pain can be a result of an injury or other medical conditions. Treatment will depend on the cause of your pain. HOME CARE  Only take medicine as told by your doctor.  Keep a healthy weight. Being overweight can make the knee hurt more.  Stretch before exercising or playing sports.  If there is constant knee pain, change the way you exercise. Ask your doctor for advice.  Make sure shoes fit well. Choose the right shoe for the sport or activity.  Protect your knees. Wear kneepads if needed.  Rest when you are tired. GET HELP RIGHT AWAY IF:   Your knee pain does not stop.  Your knee pain does not get better.  Your knee joint feels hot to the touch.  You have a fever. MAKE SURE YOU:   Understand these instructions.  Will watch this condition.  Will get help right away if you are not doing well or get worse. Document Released: 12/20/2008 Document Revised: 12/16/2011 Document Reviewed: 12/20/2008 [facility] Patient Information 2015 [place], [place]. This information is not intended to replace advice given to you by your health care provider. Make sure you discuss any questions you have with your health care provider.  Peripheral Edema You have swelling in your legs (peripheral edema). This swelling is due to excess accumulation of salt and water in your body. Edema may be a sign of heart, kidney or liver disease, or a side effect of a medication. It may also be due to problems in the leg veins. Elevating your legs and using special support stockings may be very helpful, if the cause of the swelling is due to poor venous circulation. Avoid long periods of standing, whatever the cause. Treatment of edema depends on identifying the cause. Chips, pretzels, pickles and other salty foods should be avoided. Restricting salt in your diet is almost always needed. Water pills (diuretics) are often used to remove the excess salt and water from your body via urine. These medicines  prevent the kidney from reabsorbing sodium. This increases urine flow. Diuretic treatment may also result in lowering of potassium levels in your body. Potassium supplements may be needed if you have to use diuretics daily. Daily weights can help you keep track of your progress in clearing your edema. You should call your caregiver for follow up care as recommended. SEEK IMMEDIATE MEDICAL CARE IF:   You have increased swelling, pain, redness, or heat in your legs.  You develop shortness of breath, especially when lying down.  You develop chest or abdominal pain, weakness, or fainting.  You have a fever. Document Released: 10/31/2004 Document Revised: 12/16/2011 Document Reviewed: 10/11/2009 [facility] Patient Information 2015 [place], [place]. This information is not intended to replace advice given to you by your health care provider. Make sure you discuss any questions you have with your health care provider.

## 2015-03-20 NOTE — ED Notes (Addendum)
Pain, swelling of lt knee since March. NO known injury, wearing a brace, Sees MD in [place]., Last seen in March

## 2015-03-20 NOTE — ED Provider Notes (Signed)
CSN: [id number]     Arrival date & time 03/20/15  1248 History  This chart was scribed for Pauline Aus, PA-C, working with Azalia Bilis, MD by Octavia Heir, ED Scribe. This patient was seen in room APFT24/APFT24 and the patient's care was started at 1:48 PM.    Chief Complaint  Patient presents with  . Knee Pain      The history is provided by the patient. No language interpreter was used.    HPI Comments: Pamela [name] is a 59 y.o. female who has a PMHx of DM presents to the Emergency Department complaining of constant, gradual worsening left knee pain onset one week ago. Pt has associated bilateral ankle swelling that is recurrent.   She notes a "sharp, burning" pain that radiates from her knee  to her ankles. Pt notes bearing weight and ambulating worsens the pain. Pt takes a fluid pill to help reduce the swelling in her legs. She reports doing exercises and extensive walking for the past week. Pt notes she had been seeing a orthopedic specialist on May 31st, and was given a shot in her knee which temporarily helped her pain. Pt denies hx of blood clots, fever, chill, injury, redness, numbness or weakness of the lower extremities or swelling   Past Medical History  Diagnosis Date  . Diabetes   . Hypertension   . Heart valve problem    Past Surgical History  Procedure Laterality Date  . Appendectomy    . Tubal ligation    . Breast lumpectomy     History reviewed. No pertinent family history. History  Substance Use Topics  . Smoking status: Never Smoker   . Smokeless tobacco: Not on file  . Alcohol Use: No   OB History    No data available     Review of Systems  Constitutional: Negative for fever and chills.  Genitourinary: Negative for dysuria and difficulty urinating.  Musculoskeletal: Positive for arthralgias (left knee pain). Negative for joint swelling.  Skin: Negative for color change and wound.  All other systems reviewed and are negative.     Allergies   Aspirin; Bee venom; and Penicillins  Home Medications   Prior to Admission medications   Medication Sig Start Date End Date Taking? Authorizing Provider  amLODipine (NORVASC) 10 MG tablet Take 10 mg by mouth every morning.    Historical Provider, MD  aspirin EC 81 MG tablet Take 81 mg by mouth at bedtime.    Historical Provider, MD  cyclobenzaprine (FLEXERIL) 5 MG tablet Take 5 mg by mouth 3 (three) times daily as needed. For muscle pain    Historical Provider, MD  hydrochlorothiazide (HYDRODIURIL) 25 MG tablet Take 25 mg by mouth every morning.    Historical Provider, MD  lisinopril (PRINIVIL,ZESTRIL) 10 MG tablet Take 10 mg by mouth every morning.    Historical Provider, MD  metFORMIN (GLUCOPHAGE) 500 MG tablet Take 500 mg by mouth 2 (two) times daily with a meal.    Historical Provider, MD  metoprolol (LOPRESSOR) 100 MG tablet Take 100 mg by mouth every morning.    Historical Provider, MD  metroNIDAZOLE (FLAGYL) 500 MG tablet Take 1 tablet (500 mg total) by mouth 2 (two) times daily. 10/01/12   Burgess Amor, PA-C  nitrofurantoin, macrocrystal-monohydrate, (MACROBID) 100 MG capsule Take 1 capsule (100 mg total) by mouth 2 (two) times daily. 10/01/12   Burgess Amor, PA-C  nitroGLYCERIN (NITROSTAT) 0.4 MG SL tablet Place 0.4 mg under the tongue every 5 (five)  minutes x 3 doses as needed. For chest pain    Historical Provider, MD  pravastatin (PRAVACHOL) 40 MG tablet Take 40 mg by mouth at bedtime.    Historical Provider, MD  ranitidine (ZANTAC) 150 MG tablet Take 150 mg by mouth daily as needed. For heart burn and indigestion    Historical Provider, MD  traMADol (ULTRAM) 50 MG tablet Take 50 mg by mouth every 6 (six) hours as needed. For pain    Historical Provider, MD   Triage vitals: BP 168/81 mmHg  Pulse 107  Temp(Src) 97.9 F (36.6 C) (Oral)  Resp 17  Ht 5\' 4"  (1.626 m)  Wt 222 lb (100.699 kg)  BMI 38.09 kg/m2  SpO2 100% Physical Exam  Constitutional: She is oriented to person, place,  and time. She appears well-developed and well-nourished. No distress.  HENT:  Head: Normocephalic and atraumatic.  Mouth/Throat: Oropharynx is clear and moist. No oropharyngeal exudate.  Cardiovascular: Normal rate, regular rhythm, normal heart sounds and intact distal pulses.   Pulmonary/Chest: Effort normal and breath sounds normal. No respiratory distress.  Abdominal: Soft.  Musculoskeletal: Normal range of motion. She exhibits tenderness. She exhibits no edema.  Localized tenderness to medial and posterior left knee Moderate patellar crepitus No effusion, no erythema Bilateral lower extremity edema 1+  Neurological: She is alert and oriented to person, place, and time. She exhibits normal muscle tone. Coordination normal.  Skin: Skin is warm. No rash noted. No erythema.  Psychiatric: She has a normal mood and affect.  Nursing note and vitals reviewed.   ED Course  Procedures  DIAGNOSTIC STUDIES: Oxygen Saturation is 100% on RA, normal by my interpretation.  COORDINATION OF CARE:  1:52 PM Discussed treatment plan which includes x-ray with pt at bedside and pt agreed to plan.   Labs Review Labs Reviewed - No data to display  Imaging Review Dg Knee Complete 4 Views Left  03/20/2015   CLINICAL DATA:  Swelling of the right knee since March, 2016  EXAM: LEFT KNEE - COMPLETE 4+ VIEW  COMPARISON:  None.  FINDINGS: Moderate to prominent chondral thinning in the medial compartment. Spurring of the tibial spine and intercondylar notch with marginal spurring in the medial and lateral compartments.  Small to moderate knee effusion.  Articular spurring in the patella.  IMPRESSION: 1. Osteoarthritis, with loss of articular cartilage most striking in the medial compartment, and tricompartmental spurring. 2. Small to moderate knee effusion in the suprapatellar bursa.   Electronically Signed   By: Gaylyn Rong M.D.   On: 03/20/2015 14:15     EKG Interpretation None      MDM   Final  diagnoses:  Knee pain, left  Peripheral edema   [facility].  No recent rx on file.   Pt wearing her own orthopedic knee brace.  No drainable effusion.  No clinical suspecion for DVT or septic joint.  Ankle edema is felt to be dependant edema secondary to recent exercise.  Pt agrees to symptomatic tx and f/u back up with her ortho  I personally performed the services described in this documentation, which was scribed in my presence. The recorded information has been reviewed and is accurate.   Pauline Aus, PA-C 03/22/15 1753  Azalia Bilis, MD 03/24/15 [phone]

## 2015-03-20 NOTE — ED Notes (Signed)
Pt verbalized understanding of no driving and to use caution within 4 hours of taking pain meds due to meds cause drowsiness 

## 2015-10-04 ENCOUNTER — Encounter (HOSPITAL_COMMUNITY): Payer: Self-pay | Admitting: Emergency Medicine

## 2015-10-04 ENCOUNTER — Emergency Department (HOSPITAL_COMMUNITY): Payer: BLUE CROSS/BLUE SHIELD

## 2015-10-04 ENCOUNTER — Emergency Department (HOSPITAL_COMMUNITY)
Admission: EM | Admit: 2015-10-04 | Discharge: 2015-10-04 | Disposition: A | Discharge status: Home / Self Care | Payer: BLUE CROSS/BLUE SHIELD | Attending: Emergency Medicine | Admitting: Emergency Medicine

## 2015-10-04 DIAGNOSIS — Y9389 Activity, other specified: Secondary | ICD-10-CM | POA: Insufficient documentation

## 2015-10-04 DIAGNOSIS — Y9289 Other specified places as the place of occurrence of the external cause: Secondary | ICD-10-CM | POA: Insufficient documentation

## 2015-10-04 DIAGNOSIS — Z791 Long term (current) use of non-steroidal anti-inflammatories (NSAID): Secondary | ICD-10-CM | POA: Diagnosis not present

## 2015-10-04 DIAGNOSIS — I10 Essential (primary) hypertension: Secondary | ICD-10-CM | POA: Diagnosis not present

## 2015-10-04 DIAGNOSIS — Z7982 Long term (current) use of aspirin: Secondary | ICD-10-CM | POA: Insufficient documentation

## 2015-10-04 DIAGNOSIS — I38 Endocarditis, valve unspecified: Secondary | ICD-10-CM | POA: Insufficient documentation

## 2015-10-04 DIAGNOSIS — Z79899 Other long term (current) drug therapy: Secondary | ICD-10-CM | POA: Insufficient documentation

## 2015-10-04 DIAGNOSIS — Z794 Long term (current) use of insulin: Secondary | ICD-10-CM | POA: Insufficient documentation

## 2015-10-04 DIAGNOSIS — R42 Dizziness and giddiness: Secondary | ICD-10-CM | POA: Insufficient documentation

## 2015-10-04 DIAGNOSIS — R509 Fever, unspecified: Secondary | ICD-10-CM | POA: Insufficient documentation

## 2015-10-04 DIAGNOSIS — S8991XA Unspecified injury of right lower leg, initial encounter: Secondary | ICD-10-CM | POA: Diagnosis not present

## 2015-10-04 DIAGNOSIS — Y998 Other external cause status: Secondary | ICD-10-CM | POA: Diagnosis not present

## 2015-10-04 DIAGNOSIS — R55 Syncope and collapse: Secondary | ICD-10-CM | POA: Diagnosis present

## 2015-10-04 DIAGNOSIS — S0993XA Unspecified injury of face, initial encounter: Secondary | ICD-10-CM | POA: Diagnosis not present

## 2015-10-04 DIAGNOSIS — E119 Type 2 diabetes mellitus without complications: Secondary | ICD-10-CM | POA: Insufficient documentation

## 2015-10-04 DIAGNOSIS — Z7984 Long term (current) use of oral hypoglycemic drugs: Secondary | ICD-10-CM | POA: Insufficient documentation

## 2015-10-04 DIAGNOSIS — W1830XA Fall on same level, unspecified, initial encounter: Secondary | ICD-10-CM | POA: Diagnosis not present

## 2015-10-04 DIAGNOSIS — Z880 Allergy status to penicillin: Secondary | ICD-10-CM | POA: Insufficient documentation

## 2015-10-04 DIAGNOSIS — R05 Cough: Secondary | ICD-10-CM | POA: Insufficient documentation

## 2015-10-04 LAB — BASIC METABOLIC PANEL
Anion gap: 7 (ref 5–15)
BUN: 16 mg/dL (ref 6–20)
CALCIUM: 9 mg/dL (ref 8.9–10.3)
CO2: 29 mmol/L (ref 22–32)
CREATININE: 1.08 mg/dL — AB (ref 0.44–1.00)
Chloride: 98 mmol/L — ABNORMAL LOW (ref 101–111)
GFR calc non Af Amer: 55 mL/min — ABNORMAL LOW (ref >60)
GLUCOSE: 207 mg/dL — AB (ref 65–99)
Potassium: 4 mmol/L (ref 3.5–5.1)
SODIUM: 134 mmol/L — AB (ref 135–145)

## 2015-10-04 LAB — CBC
HCT: 36.8 % (ref 36.0–46.0)
HEMOGLOBIN: 11.8 g/dL — AB (ref 12.0–15.0)
MCH: 29 pg (ref 26.0–34.0)
MCHC: 32.1 g/dL (ref 30.0–36.0)
MCV: 90.4 fL (ref 78.0–100.0)
Platelets: 214 10*3/uL (ref 150–400)
RBC: 4.07 MIL/uL (ref 3.87–5.11)
RDW: 12.6 % (ref 11.5–15.5)
WBC: 4.5 10*3/uL (ref 4.0–10.5)

## 2015-10-04 LAB — INFLUENZA PANEL BY PCR (TYPE A & B)
H1N1 flu by pcr: NOT DETECTED
INFLAPCR: NEGATIVE
INFLBPCR: NEGATIVE

## 2015-10-04 LAB — I-STAT TROPONIN, ED: Troponin i, poc: 0 ng/mL (ref 0.00–0.08)

## 2015-10-04 MED ORDER — SODIUM CHLORIDE 0.9 % IV BOLUS (SEPSIS)
1000.0000 mL | Freq: Once | INTRAVENOUS | Status: AC
  Administered 2015-10-04: 1000 mL via INTRAVENOUS

## 2015-10-04 MED ORDER — ACETAMINOPHEN 325 MG PO TABS
650.0000 mg | ORAL_TABLET | Freq: Once | ORAL | Status: AC
  Administered 2015-10-04: 650 mg via ORAL
  Filled 2015-10-04: qty 2

## 2015-10-04 MED ORDER — OSELTAMIVIR PHOSPHATE 75 MG PO CAPS
75.0000 mg | ORAL_CAPSULE | Freq: Once | ORAL | Status: AC
  Administered 2015-10-04: 75 mg via ORAL
  Filled 2015-10-04: qty 1

## 2015-10-04 NOTE — Discharge Instructions (Signed)
Get plenty of rest and drink a lot of fluids. Take Tylenol every 4 hours for fever. Return here if needed, for problems.    Fever, Adult A fever is an increase in the body's temperature. It is often defined as a temperature of 100 F (38C) or higher. Short mild or moderate fevers often have no long-term effects. They also often do not need treatment. Moderate or high fevers may make you feel uncomfortable. Sometimes, they can also be a sign of a serious illness or disease. The sweating that may happen with repeated fevers or fevers that last a while may also cause you to not have enough fluid in your body (dehydration). You can take your temperature with a thermometer to see if you have a fever. A measured temperature can change with:  Age.  Time of day.  Where the thermometer is placed:  Mouth (oral).  Rectum (rectal).  Ear (tympanic).  Underarm (axillary).  Forehead (temporal). HOME CARE Pay attention to any changes in your symptoms. Take these actions to help with your condition:  Take over-the-counter and prescription medicines only as told by your doctor. Follow the dosing instructions carefully.  If you were prescribed an antibiotic medicine, take it as told by your doctor. Do not stop taking the antibiotic even if you start to feel better.  Rest as needed.  Drink enough fluid to keep your pee (urine) clear or pale yellow.  Sponge yourself or bathe with room-temperature water as needed. This helps to lower your body temperature . Do not use ice water.  Do not wear too many blankets or heavy clothes. GET HELP IF:  You throw up (vomit).  You cannot eat or drink without throwing up.  You have watery poop (diarrhea).  It hurts when you pee.  Your symptoms do not get better with treatment.  You have new symptoms.  You feel very weak. GET HELP RIGHT AWAY IF:  You are short of breath or have trouble breathing.  You are dizzy or you pass out (faint).  You  feel confused.  You have signs of not having enough fluid in your body, such as:  A dry mouth.  Peeing less.  Looking pale.  You have very bad pain in your belly (abdomen).  You keep throwing up or having water poop.  You have a skin rash.  Your symptoms suddenly get worse.   This information is not intended to replace advice given to you by your health care provider. Make sure you discuss any questions you have with your health care provider.   Document Released: 07/02/2008 Document Revised: 06/14/2015 Document Reviewed: 11/17/2014 Elsevier Interactive Patient Education [employer]2016 [employer] Inc.

## 2015-10-04 NOTE — ED Notes (Signed)
Pt and family updated on plan of care,  

## 2015-10-04 NOTE — ED Notes (Signed)
Pt ambulatory to restroom, tolerated well,  

## 2015-10-04 NOTE — ED Notes (Signed)
Pt states she has passed out twice, dizziness, and cough.

## 2015-10-04 NOTE — ED Provider Notes (Signed)
CSN: [id number]     Arrival date & time 10/04/15  0016 History   First MD Initiated Contact with Patient 10/04/15 0209     Chief Complaint  Patient presents with  . Loss of Consciousness     (Consider location/radiation/quality/duration/timing/severity/associated sxs/prior Treatment) HPI   Pamela [name] is a 59 y.o. female who presents for evaluation of cough, fever, dizziness and syncope 2. The syncope occurred in short succession, after standing up from sitting on the commode. She injured her face, and right knee when she fell. She called family members who brought her to the emergency department. He had decreased appetite today, because she did not feel well. She has felt warm but not taken her temperature. No known sick exposures. She states she has diabetes but has not been following her blood sugar. There are no other known modifying factors.   Past Medical History  Diagnosis Date  . Diabetes (HCC)   . Hypertension   . Heart valve problem    Past Surgical History  Procedure Laterality Date  . Appendectomy    . Tubal ligation    . Breast lumpectomy     History reviewed. No pertinent family history. Social History  Substance Use Topics  . Smoking status: Never Smoker   . Smokeless tobacco: None  . Alcohol Use: No   OB History    No data available     Review of Systems  All other systems reviewed and are negative.     Allergies  Aspirin; Bee venom; and Penicillins  Home Medications   Prior to Admission medications   Medication Sig Start Date End Date Taking? Authorizing Provider  aspirin EC 81 MG tablet Take 81 mg by mouth at bedtime.   Yes Historical Provider, MD  Cholecalciferol (HM VITAMIN D3) 2000 UNITS CAPS Take 1 capsule by mouth daily.   Yes Historical Provider, MD  cyclobenzaprine (FLEXERIL) 10 MG tablet Take 10 mg by mouth 3 (three) times daily as needed for muscle spasms.   Yes Historical Provider, MD  gabapentin (NEURONTIN) 300 MG capsule Take 300  mg by mouth 3 (three) times daily.   Yes Historical Provider, MD  hydrochlorothiazide (HYDRODIURIL) 25 MG tablet Take 25 mg by mouth every morning.   Yes Historical Provider, MD  Liraglutide (VICTOZA) 18 MG/3ML SOPN Inject 1.8 mg into the skin daily.   Yes Historical Provider, MD  lisinopril (PRINIVIL,ZESTRIL) 20 MG tablet Take 20 mg by mouth daily. 02/14/15  Yes Historical Provider, MD  metFORMIN (GLUCOPHAGE) 500 MG tablet Take 500 mg by mouth 2 (two) times daily with a meal.   Yes Historical Provider, MD  naproxen (NAPROSYN) 500 MG tablet Take 500 mg by mouth 2 (two) times daily with a meal.   Yes Historical Provider, MD  nitroGLYCERIN (NITROSTAT) 0.4 MG SL tablet Place 0.4 mg under the tongue every 5 (five) minutes x 3 doses as needed. For chest pain   Yes Historical Provider, MD  Omega-3 Fatty Acids (FISH OIL) 1000 MG CAPS Take 1 capsule by mouth daily.   Yes Historical Provider, MD  omeprazole (PRILOSEC) 20 MG capsule Take 20 mg by mouth daily. 02/24/15  Yes Historical Provider, MD  pioglitazone (ACTOS) 30 MG tablet Take 30 mg by mouth every morning. 03/01/15  Yes Historical Provider, MD  pravastatin (PRAVACHOL) 40 MG tablet Take 40 mg by mouth at bedtime.   Yes Historical Provider, MD  metroNIDAZOLE (FLAGYL) 500 MG tablet Take 1 tablet (500 mg total) by mouth 2 (two) times  daily. Patient not taking: Reported on 03/20/2015 10/01/12   Burgess Amor, PA-C  nitrofurantoin, macrocrystal-monohydrate, (MACROBID) 100 MG capsule Take 1 capsule (100 mg total) by mouth 2 (two) times daily. Patient not taking: Reported on 03/20/2015 10/01/12   Burgess Amor, PA-C  oxyCODONE-acetaminophen (PERCOCET/ROXICET) 5-325 MG per tablet Take 1 tablet by mouth every 4 (four) hours as needed. 03/20/15   Tammy Triplett, PA-C   BP 134/67 mmHg  Pulse 92  Temp(Src) 98.6 F (37 C) (Oral)  Resp 23  Ht  (1.626 m)  Wt 217 lb (98.431 kg)  BMI 37.23 kg/m2  SpO2 94% Physical Exam  Constitutional: She is oriented to person,  place, and time. She appears well-developed and well-nourished.  HENT:  Head: Normocephalic and atraumatic.  Right Ear: External ear normal.  Left Ear: External ear normal.  Mild tenderness, right cheek without crepitation, deformity or swelling.  Eyes: Conjunctivae and EOM are normal. Pupils are equal, round, and reactive to light.  Neck: Normal range of motion and phonation normal. Neck supple.  Cardiovascular: Normal rate, regular rhythm and normal heart sounds.   Pulmonary/Chest: Effort normal and breath sounds normal. She exhibits no bony tenderness.  Abdominal: Soft. There is no tenderness.  Musculoskeletal: Normal range of motion.  Tender right knee, with normal range of motion.  Neurological: She is alert and oriented to person, place, and time. No cranial nerve deficit or sensory deficit. She exhibits normal muscle tone. Coordination normal.  Skin: Skin is warm, dry and intact.  Psychiatric: She has a normal mood and affect. Her behavior is normal. Judgment and thought content normal.  Nursing note and vitals reviewed.   ED Course  Procedures (including critical care time)  Medications  sodium chloride 0.9 % bolus 1,000 mL (0 mLs Intravenous Stopped 10/04/15 0346)  acetaminophen (TYLENOL) tablet 650 mg (650 mg Oral Given 10/04/15 0301)  sodium chloride 0.9 % bolus 1,000 mL (0 mLs Intravenous Stopped 10/04/15 0455)  oseltamivir (TAMIFLU) capsule 75 mg (75 mg Oral Given 10/04/15 0317)    Patient Vitals for the past 24 hrs:  BP Temp Temp src Pulse Resp SpO2 Height Weight  10/04/15 0500 134/67 mmHg - - 92 23 94 % - -  10/04/15 0458 - 98.6 F (37 C) Oral - - - - -  10/04/15 0430 124/59 mmHg - - 95 25 94 % - -  10/04/15 0400 (!) 121/52 mmHg - - 103 24 95 % - -  10/04/15 0348 - 99.1 F (37.3 C) Oral - - - - -  10/04/15 0330 124/56 mmHg - - 96 25 95 % - -  10/04/15 0300 119/78 mmHg - - 109 21 100 % - -  10/04/15 0256 - 101.3 F (38.5 C) Rectal - - - - -  10/04/15 0239  137/57 mmHg - - 109 (!) 29 94 % - -  10/04/15 0130 136/67 mmHg - - 105 12 92 % - -  10/04/15 0044 102/65 mmHg 100.3 F (37.9 C) - (!) 121 22 100 %  (1.626 m) 217 lb (98.431 kg)    5:30 AM Reevaluation with update and discussion. After initial assessment and treatment, an updated evaluation reveals she is ambulating easily and tolerating fluids. Repeat vital signs are reassuring. Findings discussed with patient and family member, all questions were answered. Leotta Weingarten L    Labs Review Labs Reviewed  BASIC METABOLIC PANEL - Abnormal; Notable for the following:    Sodium 134 (*)    Chloride 98 (*)  Glucose, Bld 207 (*)    Creatinine, Ser 1.08 (*)    GFR calc non Af Amer 55 (*)    All other components within normal limits  CBC - Abnormal; Notable for the following:    Hemoglobin 11.8 (*)    All other components within normal limits  INFLUENZA PANEL BY PCR (TYPE A & B, H1N1)  I-STAT TROPOININ, ED    Imaging Review Dg Chest 2 View  10/04/2015  CLINICAL DATA:  59 year old female with cough and fever EXAM: CHEST  2 VIEW COMPARISON:  Radiograph dated 05/16/2009 FINDINGS: The heart size and mediastinal contours are within normal limits. Both lungs are clear. The visualized skeletal structures are unremarkable. IMPRESSION: No active cardiopulmonary disease. Electronically Signed   By: Elgie CollardArash  Radparvar M.D.   On: 10/04/2015 02:02   I have personally reviewed and evaluated these images and lab results as part of my medical decision-making.   EKG Interpretation   Date/Time:  Wednesday October 04 2015 01:13:29 EST Ventricular Rate:  107 PR Interval:  144 QRS Duration: 79 QT Interval:  326 QTC Calculation: 435 R Axis:   57 Text Interpretation:  Sinus tachycardia Since last tracing rate faster  Confirmed by Braylee Bosher  MD, Tylek Boney ([id number]) on 10/04/2015 3:05:11 AM      MDM   Final diagnoses:  Febrile illness    Nonspecific febrile illness, test for influenza were negative.  Doubt significant viral infection, serious bacterial infection, impending vascular collapse.  Nursing Notes Reviewed/ Care Coordinated Applicable Imaging Reviewed Interpretation of Laboratory Data incorporated into ED treatment  The patient appears reasonably screened and/or stabilized for discharge and I doubt any other medical condition or other [facility] requiring further screening, evaluation, or treatment in the ED at this time prior to discharge.  Plan: Home Medications- Tylenol; Home Treatments- rest, fluids; return here if the recommended treatment, does not improve the symptoms; Recommended follow up- PCP prn   Mancel BaleElliott Daison Braxton, MD 10/04/15 [phone]

## 2015-10-04 NOTE — ED Notes (Signed)
Pt states that she passed out after using the restroom, has been sick with cough, denies having bowel movement when she passed out, pt states that she has been running fever, chills, aching all over, pt sinus tach on monitor with rate 104

## 2015-10-04 NOTE — ED Notes (Signed)
Dr Wentz at bedside,  

## 2015-12-20 ENCOUNTER — Emergency Department (HOSPITAL_COMMUNITY)
Admission: EM | Admit: 2015-12-20 | Discharge: 2015-12-20 | Disposition: A | Discharge status: Home / Self Care | Payer: BLUE CROSS/BLUE SHIELD | Attending: Emergency Medicine | Admitting: Emergency Medicine

## 2015-12-20 ENCOUNTER — Encounter (HOSPITAL_COMMUNITY): Payer: Self-pay

## 2015-12-20 DIAGNOSIS — J029 Acute pharyngitis, unspecified: Secondary | ICD-10-CM | POA: Diagnosis present

## 2015-12-20 DIAGNOSIS — J020 Streptococcal pharyngitis: Secondary | ICD-10-CM | POA: Insufficient documentation

## 2015-12-20 DIAGNOSIS — E119 Type 2 diabetes mellitus without complications: Secondary | ICD-10-CM | POA: Insufficient documentation

## 2015-12-20 DIAGNOSIS — Z7984 Long term (current) use of oral hypoglycemic drugs: Secondary | ICD-10-CM | POA: Insufficient documentation

## 2015-12-20 DIAGNOSIS — Z794 Long term (current) use of insulin: Secondary | ICD-10-CM | POA: Diagnosis not present

## 2015-12-20 DIAGNOSIS — Z79899 Other long term (current) drug therapy: Secondary | ICD-10-CM | POA: Diagnosis not present

## 2015-12-20 DIAGNOSIS — I10 Essential (primary) hypertension: Secondary | ICD-10-CM | POA: Diagnosis not present

## 2015-12-20 DIAGNOSIS — B950 Streptococcus, group A, as the cause of diseases classified elsewhere: Secondary | ICD-10-CM | POA: Insufficient documentation

## 2015-12-20 LAB — RAPID STREP SCREEN (MED CTR MEBANE ONLY): STREPTOCOCCUS, GROUP A SCREEN (DIRECT): POSITIVE — AB

## 2015-12-20 MED ORDER — AZITHROMYCIN 250 MG PO TABS: ORAL_TABLET | ORAL | Status: DC

## 2015-12-20 NOTE — Discharge Instructions (Signed)
Strep test was positive. Prescription for antibiotic.  Gargle with salt water. Tylenol or ibuprofen for pain or fever. Increase fluids

## 2015-12-20 NOTE — ED Notes (Signed)
Pt c/o sore throat since Sunday.  Reports low grade fever at home.

## 2015-12-20 NOTE — ED Provider Notes (Signed)
CSN: [id number]     Arrival date & time 12/20/15  1450 History   First MD Initiated Contact with Patient 12/20/15 1602     Chief Complaint  Patient presents with  . Sore Throat     (Consider location/radiation/quality/duration/timing/severity/associated sxs/prior Treatment) HPI... Sore throat since Sunday with low-grade fever. Decreased oral intake. No stiff neck or neurological deficits. Severity of symptoms is mild to moderate. Nothing makes symptoms better or worse.  Past Medical History  Diagnosis Date  . Diabetes (HCC)   . Hypertension   . Heart valve problem    Past Surgical History  Procedure Laterality Date  . Appendectomy    . Tubal ligation    . Breast lumpectomy     No family history on file. Social History  Substance Use Topics  . Smoking status: Never Smoker   . Smokeless tobacco: None  . Alcohol Use: No   OB History    No data available     Review of Systems  All other systems reviewed and are negative.     Allergies  Aspirin; Bee venom; and Penicillins  Home Medications   Prior to Admission medications   Medication Sig Start Date End Date Taking? Authorizing Provider  amLODipine (NORVASC) 2.5 MG tablet Take 2.5 mg by mouth daily. 11/13/15  Yes Historical Provider, MD  aspirin 325 MG EC tablet Take 325 mg by mouth daily.   Yes Historical Provider, MD  benzonatate (TESSALON) 200 MG capsule Take 1 capsule by mouth 3 (three) times daily as needed for cough.  11/17/15  Yes Historical Provider, MD  chlorpheniramine-HYDROcodone (TUSSIONEX) 10-8 MG/5ML SUER Take 5 mLs by mouth 2 (two) times daily as needed for cough.  10/12/15  Yes Historical Provider, MD  Cholecalciferol (HM VITAMIN D3) 2000 UNITS CAPS Take 1 capsule by mouth daily.   Yes Historical Provider, MD  cyclobenzaprine (FLEXERIL) 10 MG tablet Take 10 mg by mouth 3 (three) times daily as needed for muscle spasms.   Yes Historical Provider, MD  gabapentin (NEURONTIN) 300 MG capsule Take 300-600 mg by  mouth 2 (two) times daily. 1 capsule in the morning and 2 capsules in the evening.   Yes Historical Provider, MD  hydrochlorothiazide (HYDRODIURIL) 25 MG tablet Take 25 mg by mouth every morning.   Yes Historical Provider, MD  ibuprofen (ADVIL,MOTRIN) 200 MG tablet Take 400 mg by mouth every 6 (six) hours as needed for fever or moderate pain.   Yes Historical Provider, MD  Insulin Degludec (TRESIBA FLEXTOUCH) 200 UNIT/ML SOPN Inject 20 Units into the skin daily.   Yes Historical Provider, MD  Liraglutide (VICTOZA) 18 MG/3ML SOPN Inject 1.8 mg into the skin daily.   Yes Historical Provider, MD  lisinopril (PRINIVIL,ZESTRIL) 20 MG tablet Take 20 mg by mouth daily. 02/14/15  Yes Historical Provider, MD  metFORMIN (GLUCOPHAGE) 500 MG tablet Take 500 mg by mouth 2 (two) times daily with a meal.   Yes Historical Provider, MD  Multiple Vitamin (MULTIVITAMIN WITH MINERALS) TABS tablet Take 1 tablet by mouth daily.   Yes Historical Provider, MD  naproxen (NAPROSYN) 500 MG tablet Take 500 mg by mouth 2 (two) times daily with a meal.   Yes Historical Provider, MD  nitroGLYCERIN (NITROSTAT) 0.4 MG SL tablet Place 0.4 mg under the tongue every 5 (five) minutes x 3 doses as needed. For chest pain   Yes Historical Provider, MD  Omega-3 Fatty Acids (FISH OIL) 1000 MG CAPS Take 1 capsule by mouth daily.   Yes Historical  Provider, MD  omeprazole (PRILOSEC) 20 MG capsule Take 20 mg by mouth daily. 02/24/15  Yes Historical Provider, MD  pioglitazone (ACTOS) 30 MG tablet Take 30 mg by mouth every morning. 03/01/15  Yes Historical Provider, MD  pravastatin (PRAVACHOL) 40 MG tablet Take 40 mg by mouth at bedtime.   Yes Historical Provider, MD  traMADol (ULTRAM) 50 MG tablet Take 50 mg by mouth every 4 (four) hours as needed for moderate pain.  11/07/15  Yes Historical Provider, MD  azithromycin (ZITHROMAX Z-PAK) 250 MG tablet As directed 12/20/15   Donnetta Hutching, MD   BP 149/70 mmHg  Pulse 130  Temp(Src) 98.7 F (37.1 C)  (Oral)  Resp 16  Ht  (1.626 m)  Wt 222 lb (100.699 kg)  BMI 38.09 kg/m2  SpO2 99% Physical Exam  Constitutional: She is oriented to person, place, and time. She appears well-developed and well-nourished.  HENT:  Head: Normocephalic and atraumatic.  Tonsils are erythematous. No. peritonsillar abscess.  Eyes: Conjunctivae and EOM are normal. Pupils are equal, round, and reactive to light.  Neck: Normal range of motion. Neck supple.  Cardiovascular: Normal rate and regular rhythm.   Pulmonary/Chest: Effort normal and breath sounds normal.  Abdominal: Soft. Bowel sounds are normal.  Musculoskeletal: Normal range of motion.  Neurological: She is alert and oriented to person, place, and time.  Skin: Skin is warm and dry.  Psychiatric: She has a normal mood and affect. Her behavior is normal.  Nursing note and vitals reviewed.   ED Course  Procedures (including critical care time) Labs Review Labs Reviewed  RAPID STREP SCREEN (NOT AT [facility]) - Abnormal; Notable for the following:    Streptococcus, Group A Screen (Direct) POSITIVE (*)    All other components within normal limits    Imaging Review No results found. I have personally reviewed and evaluated these images and lab results as part of my medical decision-making.   EKG Interpretation None      MDM   Final diagnoses:  Strep pharyngitis    Strep screen positive. Rx Zithromax secondary to penicillin allergy. Patient is nontoxic-appearing.    Donnetta Hutching, MD 12/20/15 [phone]

## 2015-12-20 NOTE — ED Notes (Signed)
Patient given discharge instruction, verbalized understand. Patient ambulatory out of the department.  

## 2015-12-26 ENCOUNTER — Emergency Department (HOSPITAL_COMMUNITY)
Admission: EM | Admit: 2015-12-26 | Discharge: 2015-12-27 | Disposition: A | Discharge status: Home / Self Care | Payer: BLUE CROSS/BLUE SHIELD | Attending: Emergency Medicine | Admitting: Emergency Medicine

## 2015-12-26 ENCOUNTER — Encounter (HOSPITAL_COMMUNITY): Payer: Self-pay | Admitting: *Deleted

## 2015-12-26 ENCOUNTER — Emergency Department (HOSPITAL_COMMUNITY): Payer: BLUE CROSS/BLUE SHIELD

## 2015-12-26 DIAGNOSIS — Z7982 Long term (current) use of aspirin: Secondary | ICD-10-CM | POA: Insufficient documentation

## 2015-12-26 DIAGNOSIS — E860 Dehydration: Secondary | ICD-10-CM | POA: Insufficient documentation

## 2015-12-26 DIAGNOSIS — Z794 Long term (current) use of insulin: Secondary | ICD-10-CM | POA: Diagnosis not present

## 2015-12-26 DIAGNOSIS — E119 Type 2 diabetes mellitus without complications: Secondary | ICD-10-CM | POA: Insufficient documentation

## 2015-12-26 DIAGNOSIS — J029 Acute pharyngitis, unspecified: Secondary | ICD-10-CM | POA: Diagnosis present

## 2015-12-26 DIAGNOSIS — J36 Peritonsillar abscess: Secondary | ICD-10-CM | POA: Diagnosis not present

## 2015-12-26 DIAGNOSIS — I10 Essential (primary) hypertension: Secondary | ICD-10-CM | POA: Diagnosis not present

## 2015-12-26 LAB — COMPREHENSIVE METABOLIC PANEL
ALT: 21 U/L (ref 14–54)
ANION GAP: 8 (ref 5–15)
AST: 16 U/L (ref 15–41)
Albumin: 3.2 g/dL — ABNORMAL LOW (ref 3.5–5.0)
Alkaline Phosphatase: 62 U/L (ref 38–126)
BILIRUBIN TOTAL: 0.5 mg/dL (ref 0.3–1.2)
BUN: 16 mg/dL (ref 6–20)
CO2: 27 mmol/L (ref 22–32)
Calcium: 9.2 mg/dL (ref 8.9–10.3)
Chloride: 99 mmol/L — ABNORMAL LOW (ref 101–111)
Creatinine, Ser: 0.78 mg/dL (ref 0.44–1.00)
GFR calc Af Amer: >60 mL/min (ref >60)
Glucose, Bld: 134 mg/dL — ABNORMAL HIGH (ref 65–99)
POTASSIUM: 3.8 mmol/L (ref 3.5–5.1)
Sodium: 134 mmol/L — ABNORMAL LOW (ref 135–145)
TOTAL PROTEIN: 8.1 g/dL (ref 6.5–8.1)

## 2015-12-26 LAB — CBC WITH DIFFERENTIAL/PLATELET
BASOS ABS: 0 10*3/uL (ref 0.0–0.1)
BASOS PCT: 0 %
EOS PCT: 0 %
Eosinophils Absolute: 0 10*3/uL (ref 0.0–0.7)
HEMATOCRIT: 34.5 % — AB (ref 36.0–46.0)
Hemoglobin: 11.2 g/dL — ABNORMAL LOW (ref 12.0–15.0)
LYMPHS PCT: 12 %
Lymphs Abs: 1.2 10*3/uL (ref 0.7–4.0)
MCH: 28.9 pg (ref 26.0–34.0)
MCHC: 32.5 g/dL (ref 30.0–36.0)
MCV: 89.1 fL (ref 78.0–100.0)
MONO ABS: 0.7 10*3/uL (ref 0.1–1.0)
Monocytes Relative: 7 %
NEUTROS ABS: 7.8 10*3/uL — AB (ref 1.7–7.7)
Neutrophils Relative %: 81 %
PLATELETS: 390 10*3/uL (ref 150–400)
RBC: 3.87 MIL/uL (ref 3.87–5.11)
RDW: 13.2 % (ref 11.5–15.5)
WBC: 9.7 10*3/uL (ref 4.0–10.5)

## 2015-12-26 LAB — LACTIC ACID, PLASMA: LACTIC ACID, VENOUS: 0.7 mmol/L (ref 0.5–2.0)

## 2015-12-26 NOTE — ED Notes (Addendum)
Pt was treated for strep last Wednesday. Pt reports continued fever, sore throat, cough, decreased appetite, and generalized weakness.

## 2015-12-26 NOTE — ED Notes (Signed)
Patient ambulated to restroom to obtain urine specimen. Sent to Lab at this time.

## 2015-12-27 ENCOUNTER — Emergency Department (HOSPITAL_COMMUNITY): Payer: BLUE CROSS/BLUE SHIELD

## 2015-12-27 LAB — URINALYSIS, ROUTINE W REFLEX MICROSCOPIC
GLUCOSE, UA: NEGATIVE mg/dL
HGB URINE DIPSTICK: NEGATIVE
Ketones, ur: 40 mg/dL — AB
Leukocytes, UA: NEGATIVE
Nitrite: NEGATIVE
Protein, ur: NEGATIVE mg/dL
SPECIFIC GRAVITY, URINE: 1.025 (ref 1.005–1.030)
pH: 6 (ref 5.0–8.0)

## 2015-12-27 LAB — MONONUCLEOSIS SCREEN: Mono Screen: NEGATIVE

## 2015-12-27 LAB — RAPID STREP SCREEN (MED CTR MEBANE ONLY): Streptococcus, Group A Screen (Direct): NEGATIVE

## 2015-12-27 MED ORDER — SODIUM CHLORIDE 0.9 % IV BOLUS (SEPSIS)
1000.0000 mL | Freq: Once | INTRAVENOUS | Status: AC
  Administered 2015-12-27: 1000 mL via INTRAVENOUS

## 2015-12-27 MED ORDER — METHYLPREDNISOLONE SODIUM SUCC 125 MG IJ SOLR
125.0000 mg | Freq: Once | INTRAMUSCULAR | Status: AC
  Administered 2015-12-27: 125 mg via INTRAVENOUS
  Filled 2015-12-27: qty 2

## 2015-12-27 MED ORDER — SODIUM CHLORIDE 0.9 % IV BOLUS (SEPSIS)
500.0000 mL | Freq: Once | INTRAVENOUS | Status: AC
  Administered 2015-12-27: 500 mL via INTRAVENOUS

## 2015-12-27 MED ORDER — IOHEXOL 300 MG/ML  SOLN
75.0000 mL | Freq: Once | INTRAMUSCULAR | Status: AC | PRN
  Administered 2015-12-27: 75 mL via INTRAVENOUS

## 2015-12-27 MED ORDER — CLINDAMYCIN PHOSPHATE 600 MG/50ML IV SOLN
600.0000 mg | Freq: Once | INTRAVENOUS | Status: AC
  Administered 2015-12-27: 600 mg via INTRAVENOUS
  Filled 2015-12-27: qty 50

## 2015-12-27 MED ORDER — FENTANYL CITRATE (PF) 100 MCG/2ML IJ SOLN
50.0000 ug | Freq: Once | INTRAMUSCULAR | Status: AC
  Administered 2015-12-27: 50 ug via INTRAVENOUS
  Filled 2015-12-27: qty 2

## 2015-12-27 NOTE — ED Provider Notes (Signed)
CSN: [id number]     Arrival date & time 12/26/15  2113 History   First MD Initiated Contact with Patient 12/27/15 0135   Chief Complaint  Patient presents with  . Sore Throat     (Consider location/radiation/quality/duration/timing/severity/associated sxs/prior Treatment) HPI patient reports she started getting a sore throat about 9 or 10 days ago. She was seen in the ED here on March 15 and had a positive strep test. She was started on Zithromax. She reports she was seen 2 days later, March 17 by her PCP. He put her on clindamycin and hydrocodone for pain. He told her to also finish the Z-Pak. She states she's been having difficulty swallowing since her sore throat started. She is only able to eat ice chips and applesauce. She is unaware fever but states she did have chills today. She states she's having decreased urinary output. She also describes a dry cough. She states the left side of her throat hurts more than the right and that is getting worse and she feels like the pain is getting lower in her neck. She denies feeling short of breath.  Patient states she was seen today by her arthritis doctor. She states she gets some type of injection every 3 months that she cannot recall the name of.   PCP Dr  Hart Robinsons in [place]  Past Medical History  Diagnosis Date  . Diabetes (HCC)   . Hypertension   . Heart valve problem    Past Surgical History  Procedure Laterality Date  . Appendectomy    . Tubal ligation    . Breast lumpectomy     History reviewed. No pertinent family history. Social History  Substance Use Topics  . Smoking status: Never Smoker   . Smokeless tobacco: None  . Alcohol Use: No   On disability for back/ knee  OB History    No data available     Review of Systems  All other systems reviewed and are negative.     Allergies  Aspirin; Bee venom; and Penicillins  Home Medications   Prior to Admission medications   Medication Sig Start Date End Date Taking?  Authorizing Provider  amLODipine (NORVASC) 2.5 MG tablet Take 2.5 mg by mouth daily. 11/13/15   Historical Provider, MD  aspirin 325 MG EC tablet Take 325 mg by mouth daily.    Historical Provider, MD  azithromycin (ZITHROMAX Z-PAK) 250 MG tablet As directed 12/20/15   Donnetta Hutching, MD  benzonatate (TESSALON) 200 MG capsule Take 1 capsule by mouth 3 (three) times daily as needed for cough.  11/17/15   Historical Provider, MD  chlorpheniramine-HYDROcodone (TUSSIONEX) 10-8 MG/5ML SUER Take 5 mLs by mouth 2 (two) times daily as needed for cough.  10/12/15   Historical Provider, MD  Cholecalciferol (HM VITAMIN D3) 2000 UNITS CAPS Take 1 capsule by mouth daily.    Historical Provider, MD  cyclobenzaprine (FLEXERIL) 10 MG tablet Take 10 mg by mouth 3 (three) times daily as needed for muscle spasms.    Historical Provider, MD  gabapentin (NEURONTIN) 300 MG capsule Take 300-600 mg by mouth 2 (two) times daily. 1 capsule in the morning and 2 capsules in the evening.    Historical Provider, MD  hydrochlorothiazide (HYDRODIURIL) 25 MG tablet Take 25 mg by mouth every morning.    Historical Provider, MD  ibuprofen (ADVIL,MOTRIN) 200 MG tablet Take 400 mg by mouth every 6 (six) hours as needed for fever or moderate pain.    Historical Provider, MD  Insulin Degludec (TRESIBA FLEXTOUCH) 200 UNIT/ML SOPN Inject 20 Units into the skin daily.    Historical Provider, MD  Liraglutide (VICTOZA) 18 MG/3ML SOPN Inject 1.8 mg into the skin daily.    Historical Provider, MD  lisinopril (PRINIVIL,ZESTRIL) 20 MG tablet Take 20 mg by mouth daily. 02/14/15   Historical Provider, MD  metFORMIN (GLUCOPHAGE) 500 MG tablet Take 500 mg by mouth 2 (two) times daily with a meal.    Historical Provider, MD  Multiple Vitamin (MULTIVITAMIN WITH MINERALS) TABS tablet Take 1 tablet by mouth daily.    Historical Provider, MD  naproxen (NAPROSYN) 500 MG tablet Take 500 mg by mouth 2 (two) times daily with a meal.    Historical Provider, MD   nitroGLYCERIN (NITROSTAT) 0.4 MG SL tablet Place 0.4 mg under the tongue every 5 (five) minutes x 3 doses as needed. For chest pain    Historical Provider, MD  Omega-3 Fatty Acids (FISH OIL) 1000 MG CAPS Take 1 capsule by mouth daily.    Historical Provider, MD  omeprazole (PRILOSEC) 20 MG capsule Take 20 mg by mouth daily. 02/24/15   Historical Provider, MD  pioglitazone (ACTOS) 30 MG tablet Take 30 mg by mouth every morning. 03/01/15   Historical Provider, MD  pravastatin (PRAVACHOL) 40 MG tablet Take 40 mg by mouth at bedtime.    Historical Provider, MD  traMADol (ULTRAM) 50 MG tablet Take 50 mg by mouth every 4 (four) hours as needed for moderate pain.  11/07/15   Historical Provider, MD   BP 154/76 mmHg  Pulse 134  Temp(Src) 99.3 F (37.4 C) (Oral)  Resp 18  Ht 5' 4.5" (1.638 m)  Wt 213 lb (96.616 kg)  BMI 36.01 kg/m2  SpO2 97%  Vital signs normal Except for tachycardia  Physical Exam  Constitutional: She is oriented to person, place, and time. She appears well-developed and well-nourished.  Non-toxic appearance. She does not appear ill. No distress.  HENT:  Head: Normocephalic and atraumatic.  Right Ear: External ear normal.  Left Ear: External ear normal.  Nose: Nose normal. No mucosal edema or rhinorrhea.  Mouth/Throat: Oropharynx is clear and moist and mucous membranes are normal. No dental abscesses or uvula swelling.  Some mild trismus, patient is unable to get her tongue off the roof of her mouth when I examine her oral pharynx. When I use a double tongue blade I am only briefly able to see a portion of her soft palate which has diffuse redness. I am unable to assess her tonsils or for asymmetry.  Eyes: Conjunctivae and EOM are normal. Pupils are equal, round, and reactive to light.  Neck: Normal range of motion and full passive range of motion without pain. Neck supple.  Patient's neck has fullness in it, she is very tender to palpation on the left.  Cardiovascular: Regular  rhythm and normal heart sounds.  Tachycardia present.  Exam reveals no gallop and no friction rub.   No murmur heard. Pulmonary/Chest: Effort normal and breath sounds normal. No respiratory distress. She has no wheezes. She has no rhonchi. She has no rales. She exhibits no tenderness and no crepitus.  Abdominal: Soft. Normal appearance and bowel sounds are normal. She exhibits no distension. There is no tenderness. There is no rebound and no guarding.  Musculoskeletal: Normal range of motion. She exhibits no edema or tenderness.  Moves all extremities well.   Neurological: She is alert and oriented to person, place, and time. She has normal strength. No cranial nerve  deficit.  Skin: Skin is warm, dry and intact. No rash noted. No erythema. No pallor.  Psychiatric: She has a normal mood and affect. Her speech is normal and behavior is normal. Her mood appears not anxious.  Nursing note and vitals reviewed.   ED Course  Procedures (including critical care time)  Medications  sodium chloride 0.9 % bolus 1,000 mL (0 mLs Intravenous Stopped 12/27/15 0321)  sodium chloride 0.9 % bolus 500 mL (0 mLs Intravenous Stopped 12/27/15 0520)  fentaNYL (SUBLIMAZE) injection 50 mcg (50 mcg Intravenous Given 12/27/15 0218)  iohexol (OMNIPAQUE) 300 MG/ML solution 75 mL (75 mLs Intravenous Contrast Given 12/27/15 0230)  sodium chloride 0.9 % bolus 1,000 mL (1,000 mLs Intravenous New Bag/Given 12/27/15 0402)  clindamycin (CLEOCIN) IVPB 600 mg (600 mg Intravenous New Bag/Given 12/27/15 0403)  methylPREDNISolone sodium succinate (SOLU-MEDROL) 125 mg/2 mL injection 125 mg (125 mg Intravenous Given 12/27/15 0400)   Patient was given IV fluids. She was given IV pain medication. We discussed getting a CT scan to see what was causing her worsening throat pain. She is agreeable.  After reviewing her CT scan patient was given IV antibiotic and IV steroids.  03:58 Dr Jenne PaneBates, ENT, have patient call the office at 8:30 AM and get  an appointment to be seen in the office to get her PTA drained.   04:55 pt states she is feeling better.   Labs Review  Results for orders placed or performed during the hospital encounter of 12/26/15  Rapid strep screen  Result Value Ref Range   Streptococcus, Group A Screen (Direct) NEGATIVE NEGATIVE  CBC with Differential  Result Value Ref Range   WBC 9.7 4.0 - 10.5 K/uL   RBC 3.87 3.87 - 5.11 MIL/uL   Hemoglobin 11.2 (L) 12.0 - 15.0 g/dL   HCT 40.934.5 (L) 81.136.0 - 91.446.0 %   MCV 89.1 78.0 - 100.0 fL   MCH 28.9 26.0 - 34.0 pg   MCHC 32.5 30.0 - 36.0 g/dL   RDW 78.213.2 95.611.5 - 21.315.5 %   Platelets 390 150 - 400 K/uL   Neutrophils Relative % 81 %   Neutro Abs 7.8 (H) 1.7 - 7.7 K/uL   Lymphocytes Relative 12 %   Lymphs Abs 1.2 0.7 - 4.0 K/uL   Monocytes Relative 7 %   Monocytes Absolute 0.7 0.1 - 1.0 K/uL   Eosinophils Relative 0 %   Eosinophils Absolute 0.0 0.0 - 0.7 K/uL   Basophils Relative 0 %   Basophils Absolute 0.0 0.0 - 0.1 K/uL  Comprehensive metabolic panel  Result Value Ref Range   Sodium 134 (L) 135 - 145 mmol/L   Potassium 3.8 3.5 - 5.1 mmol/L   Chloride 99 (L) 101 - 111 mmol/L   CO2 27 22 - 32 mmol/L   Glucose, Bld 134 (H) 65 - 99 mg/dL   BUN 16 6 - 20 mg/dL   Creatinine, Ser 0.860.78 0.44 - 1.00 mg/dL   Calcium 9.2 8.9 - 57.810.3 mg/dL   Total Protein 8.1 6.5 - 8.1 g/dL   Albumin 3.2 (L) 3.5 - 5.0 g/dL   AST 16 15 - 41 U/L   ALT 21 14 - 54 U/L   Alkaline Phosphatase 62 38 - 126 U/L   Total Bilirubin 0.5 0.3 - 1.2 mg/dL   GFR calc non Af Amer >60 >60 mL/min   GFR calc Af Amer >60 >60 mL/min   Anion gap 8 5 - 15  Lactic acid, plasma  Result Value Ref Range  Lactic Acid, Venous 0.7 0.5 - 2.0 mmol/L  Urinalysis, Routine w reflex microscopic (not at [facility])  Result Value Ref Range   Color, Urine AMBER (A) YELLOW   APPearance CLEAR CLEAR   Specific Gravity, Urine 1.025 1.005 - 1.030   pH 6.0 5.0 - 8.0   Glucose, UA NEGATIVE NEGATIVE mg/dL   Hgb urine dipstick NEGATIVE  NEGATIVE   Bilirubin Urine SMALL (A) NEGATIVE   Ketones, ur 40 (A) NEGATIVE mg/dL   Protein, ur NEGATIVE NEGATIVE mg/dL   Nitrite NEGATIVE NEGATIVE   Leukocytes, UA NEGATIVE NEGATIVE  Mononucleosis screen  Result Value Ref Range   Mono Screen NEGATIVE NEGATIVE    Laboratory interpretation all normal except Mild anemia   Imaging Review   Ct Soft Tissue Neck W Contrast  12/27/2015  CLINICAL DATA:  Initial valuation for 10 days of acute sore throat. EXAM: CT NECK WITH CONTRAST TECHNIQUE: Multidetector CT imaging of the neck was performed using the standard protocol following the bolus administration of intravenous contrast. CONTRAST:  75mL OMNIPAQUE IOHEXOL 300 MG/ML  SOLN COMPARISON:  None. FINDINGS: Visualized portions of the brain demonstrate no acute process. Partially visualized globes and orbits within normal limits. Retention cyst noted within the right maxillary sinus. Minimal opacity within the ethmoidal air cells. Visualized paranasal sinuses are otherwise clear. Visualized mastoids are well pneumatized. Middle ear cavities are clear. Salivary glands including the parotid glands and submandibular glands are within normal limits. Oral cavity demonstrates no definite acute abnormality, although evaluation somewhat limited by streak artifact from dental amalgam. No acute abnormality about the remaining dentition. Large left tonsillar/peritonsillar abscess is present, measuring 2.4 x 3.3 x 2.8 cm (series 2, image 35). Left tonsil is enlarged and deviated towards the midline, with a tonsils abutting at the midline. Associated mucosal and submucosal edema within the adjacent oropharynx extending down the left hypopharynx. Epiglottis is normal. Vallecula is clear. No retropharyngeal fluid collection. Left piriform sinus partially effaced by a the pharyngeal edema. Right piriform sinus normal. True cords within normal limits. Subglottic airway is clear. Increased number of left level 2/3 nodes which  measure up to 11 mm, likely reactive. There is an 11 mm right supraclavicular lymph node (series 2, image 67), indeterminate. 2.7 cm hypodense nodule within the posterior right thyroid lobe, indeterminate. Thyroid otherwise unremarkable. Visualized superior mediastinum within normal limits. Visualized lungs are clear. Normal intravascular enhancement seen throughout the neck. No acute osseous abnormality. No worrisome lytic or blastic osseous lesions. Moderate degenerative spondylolysis at C5-6 and C6-7. IMPRESSION: 1. 2.4 x 3.3 x 2.8 cm left tonsillar/ peritonsillar abscess. There is mass effect on the adjacent or pharyngeal airway which is narrowed but remains patent. Associated pharyngeal edema within the left pharynx, consistent with associated pharyngitis. 2. Increased number of mildly prominent left-sided cervical lymph nodes, likely reactive. 3. 11 mm right supraclavicular lymph node, indeterminate, and of uncertain significance. Clinical followup to resolution recommended. 4. 2.7 cm hypodense right superior pole thyroid nodule, indeterminate. Further evaluation with dedicated thyroid ultrasound recommended. This could be performed on a nonemergent basis. Electronically Signed   By: Rise Mu M.D.   On: 12/27/2015 03:29    Dg Chest 2 View  12/26/2015  CLINICAL DATA:  Dry cough for 2 days.  Sore throat for 6 days. EXAM: CHEST  2 VIEW COMPARISON:  10/04/2015 FINDINGS: The cardiomediastinal silhouette is within normal limits. Thoracic aortic atherosclerosis is noted. The lungs are well inflated and clear. No pleural effusion or pneumothorax is identified. Thoracic spondylosis is  noted. IMPRESSION: No active cardiopulmonary disease. Electronically Signed   By: Sebastian Ache M.D.   On: 12/26/2015 22:15   I have personally reviewed and evaluated these images and lab results as part of my medical decision-making.    MDM   Final diagnoses:  Peritonsillar abscess  Dehydration   Plan  discharge  Devoria Albe, MD, Concha Pyo, MD 12/27/15 [phone]

## 2015-12-27 NOTE — Discharge Instructions (Signed)
Call Dr Dionicio StallBates's office this morning when they open at 8:30 AM. They will give you an appointment time to be seen TODAY to have your "peritonsillar abscess on the left" drained.  Return to the ED if you are unable to swallow or start having shortness of breath.    Peritonsillar Abscess A peritonsillar abscess is a collection of yellowish-white fluid (pus) in the back of the throat behind the tonsils. It usually occurs when an infection of the throat or tonsils (tonsillitis) spreads into the tissues around the tonsils. CAUSES The infection that leads to a peritonsillar abscess is usually caused by streptococcal bacteria.  SIGNS AND SYMPTOMS  Sore throat, often with pain on just one side.  Swelling and tenderness of the glands (lymph nodes) in the neck.  Difficulty swallowing.  Difficulty opening your mouth.  Fever.  Chills.  Drooling because of difficulty swallowing saliva.  Headache.  Changes in your voice.  Bad breath. DIAGNOSIS Your health care provider will take your medical history and do a physical exam. Imaging tests may be done, such as an ultrasound or CT scan. A sample of pus may be removed from the abscess using a needle (needle aspiration) or by swabbing the back of your throat. This sample will be sent to a lab for testing. TREATMENT Treatment usually involves draining the pus from the abscess. This may be done through needle aspiration or by making an incision in the abscess. You will also likely need to take antibiotic medicine. HOME CARE INSTRUCTIONS  Rest as much as possible and get plenty of sleep.  Take medicines only as directed by your health care provider.  If you were prescribed an antibiotic medicine, finish it all even if you start to feel better.  If your abscess was drained by your health care provider, gargle with a mixture of salt and warm water:  Mix 1 tsp of salt in 8 oz of warm water.  Gargle with this mixture four times per day or as needed  for comfort.  Do not swallow this mixture.  Drink plenty of fluids.  While your throat is sore, eat soft or liquid foods, such as frozen ice pops and ice cream.  Keep all follow-up visits as directed by your health care provider. This is important. SEEK MEDICAL CARE IF:  You have increased pain, swelling, redness, or drainage in your throat.  You develop a headache, a lack of energy (lethargy), or generalized feelings of illness.  You have a fever.  You feel dizzy.  You have difficulty swallowing or eating.  You show signs of becoming dehydrated, such as:  Light-headedness when standing.  Decreased urine output.  A fast heart rate.  Dry mouth. SEEK IMMEDIATE MEDICAL CARE IF:   You have difficulty talking or breathing, or you find it easier to breathe when you lean forward.  You are coughing up blood or vomiting blood.  You have severe throat pain that is not helped by medicines.  You start to drool.   This information is not intended to replace advice given to you by your health care provider. Make sure you discuss any questions you have with your health care provider.   Document Released: 09/23/2005 Document Revised: 10/14/2014 Document Reviewed: 05/09/2014 [employer] Interactive Patient Education [employer]2016 [employer]

## 2015-12-29 LAB — CULTURE, GROUP A STREP (THRC)

## 2016-02-19 ENCOUNTER — Other Ambulatory Visit: Payer: Self-pay | Admitting: Physician Assistant

## 2016-02-19 DIAGNOSIS — E041 Nontoxic single thyroid nodule: Secondary | ICD-10-CM

## 2016-02-26 ENCOUNTER — Ambulatory Visit
Admission: RE | Admit: 2016-02-26 | Discharge: 2016-02-26 | Disposition: A | Discharge status: Home / Self Care | Payer: Medicare Other | Source: Ambulatory Visit | Attending: Physician Assistant | Admitting: Physician Assistant

## 2016-02-26 DIAGNOSIS — E041 Nontoxic single thyroid nodule: Secondary | ICD-10-CM

## 2016-02-29 ENCOUNTER — Other Ambulatory Visit: Payer: Self-pay | Admitting: Otolaryngology

## 2016-02-29 DIAGNOSIS — E041 Nontoxic single thyroid nodule: Secondary | ICD-10-CM

## 2016-03-06 ENCOUNTER — Other Ambulatory Visit: Payer: Medicare Other

## 2016-09-04 ENCOUNTER — Other Ambulatory Visit: Payer: Self-pay | Admitting: Otolaryngology

## 2016-09-04 DIAGNOSIS — E041 Nontoxic single thyroid nodule: Secondary | ICD-10-CM

## 2016-09-12 ENCOUNTER — Ambulatory Visit
Admission: RE | Admit: 2016-09-12 | Discharge: 2016-09-12 | Disposition: A | Discharge status: Home / Self Care | Payer: BLUE CROSS/BLUE SHIELD | Source: Ambulatory Visit | Attending: Otolaryngology | Admitting: Otolaryngology

## 2016-09-12 DIAGNOSIS — E041 Nontoxic single thyroid nodule: Secondary | ICD-10-CM

## 2016-09-28 ENCOUNTER — Emergency Department (HOSPITAL_COMMUNITY): Payer: BLUE CROSS/BLUE SHIELD

## 2016-09-28 ENCOUNTER — Emergency Department (HOSPITAL_COMMUNITY)
Admission: EM | Admit: 2016-09-28 | Discharge: 2016-09-29 | Disposition: A | Discharge status: Home / Self Care | Payer: BLUE CROSS/BLUE SHIELD | Attending: Emergency Medicine | Admitting: Emergency Medicine

## 2016-09-28 ENCOUNTER — Encounter (HOSPITAL_COMMUNITY): Payer: Self-pay | Admitting: Emergency Medicine

## 2016-09-28 DIAGNOSIS — Z7984 Long term (current) use of oral hypoglycemic drugs: Secondary | ICD-10-CM | POA: Insufficient documentation

## 2016-09-28 DIAGNOSIS — Z7982 Long term (current) use of aspirin: Secondary | ICD-10-CM | POA: Diagnosis not present

## 2016-09-28 DIAGNOSIS — E119 Type 2 diabetes mellitus without complications: Secondary | ICD-10-CM | POA: Insufficient documentation

## 2016-09-28 DIAGNOSIS — Z79899 Other long term (current) drug therapy: Secondary | ICD-10-CM | POA: Insufficient documentation

## 2016-09-28 DIAGNOSIS — K529 Noninfective gastroenteritis and colitis, unspecified: Secondary | ICD-10-CM | POA: Insufficient documentation

## 2016-09-28 DIAGNOSIS — R112 Nausea with vomiting, unspecified: Secondary | ICD-10-CM

## 2016-09-28 DIAGNOSIS — I10 Essential (primary) hypertension: Secondary | ICD-10-CM | POA: Insufficient documentation

## 2016-09-28 DIAGNOSIS — R1084 Generalized abdominal pain: Secondary | ICD-10-CM

## 2016-09-28 DIAGNOSIS — R1030 Lower abdominal pain, unspecified: Secondary | ICD-10-CM | POA: Diagnosis present

## 2016-09-28 LAB — URINALYSIS, ROUTINE W REFLEX MICROSCOPIC
Bilirubin Urine: NEGATIVE
Glucose, UA: NEGATIVE mg/dL
HGB URINE DIPSTICK: NEGATIVE
Ketones, ur: 20 mg/dL — AB
LEUKOCYTES UA: NEGATIVE
Nitrite: NEGATIVE
PROTEIN: NEGATIVE mg/dL
Specific Gravity, Urine: 1.015 (ref 1.005–1.030)
pH: 6 (ref 5.0–8.0)

## 2016-09-28 LAB — COMPREHENSIVE METABOLIC PANEL
ALK PHOS: 58 U/L (ref 38–126)
ALT: 15 U/L (ref 14–54)
AST: 24 U/L (ref 15–41)
Albumin: 3.6 g/dL (ref 3.5–5.0)
Anion gap: 11 (ref 5–15)
BUN: 21 mg/dL — ABNORMAL HIGH (ref 6–20)
CALCIUM: 9.7 mg/dL (ref 8.9–10.3)
CO2: 25 mmol/L (ref 22–32)
CREATININE: 0.96 mg/dL (ref 0.44–1.00)
Chloride: 101 mmol/L (ref 101–111)
Glucose, Bld: 179 mg/dL — ABNORMAL HIGH (ref 65–99)
Potassium: 4.1 mmol/L (ref 3.5–5.1)
Sodium: 137 mmol/L (ref 135–145)
TOTAL PROTEIN: 7.1 g/dL (ref 6.5–8.1)
Total Bilirubin: 0.7 mg/dL (ref 0.3–1.2)

## 2016-09-28 LAB — LIPASE, BLOOD: Lipase: 19 U/L (ref 11–51)

## 2016-09-28 LAB — CBC
HCT: 33.9 % — ABNORMAL LOW (ref 36.0–46.0)
Hemoglobin: 10.9 g/dL — ABNORMAL LOW (ref 12.0–15.0)
MCH: 29.3 pg (ref 26.0–34.0)
MCHC: 32.2 g/dL (ref 30.0–36.0)
MCV: 91.1 fL (ref 78.0–100.0)
PLATELETS: 261 10*3/uL (ref 150–400)
RBC: 3.72 MIL/uL — AB (ref 3.87–5.11)
RDW: 13.2 % (ref 11.5–15.5)
WBC: 8.7 10*3/uL (ref 4.0–10.5)

## 2016-09-28 LAB — POC OCCULT BLOOD, ED: FECAL OCCULT BLD: POSITIVE — AB

## 2016-09-28 MED ORDER — MORPHINE SULFATE (PF) 4 MG/ML IV SOLN
4.0000 mg | Freq: Once | INTRAVENOUS | Status: AC
  Administered 2016-09-28: 4 mg via INTRAVENOUS
  Filled 2016-09-28: qty 1

## 2016-09-28 MED ORDER — IOPAMIDOL (ISOVUE-300) INJECTION 61%
INTRAVENOUS | Status: AC
  Administered 2016-09-28: 100 mL
  Filled 2016-09-28: qty 30

## 2016-09-28 MED ORDER — ONDANSETRON HCL 4 MG/2ML IJ SOLN
4.0000 mg | Freq: Once | INTRAMUSCULAR | Status: AC
  Administered 2016-09-28: 4 mg via INTRAVENOUS
  Filled 2016-09-28: qty 2

## 2016-09-28 MED ORDER — SODIUM CHLORIDE 0.9 % IV BOLUS (SEPSIS)
500.0000 mL | Freq: Once | INTRAVENOUS | Status: AC
  Administered 2016-09-28: 500 mL via INTRAVENOUS

## 2016-09-28 MED ORDER — PROMETHAZINE HCL 25 MG/ML IJ SOLN
12.5000 mg | Freq: Once | INTRAMUSCULAR | Status: DC
  Filled 2016-09-28: qty 1

## 2016-09-28 NOTE — ED Provider Notes (Signed)
Emergency Department Provider Note   By signing my name below, I, Earmon PhoenixJennifer Waddell, attest that this documentation has been prepared under the direction and in the presence of Maia PlanJoshua G Analilia Geddis, MD. Electronically Signed: Earmon PhoenixJennifer Waddell, ED Scribe. 09/28/16. 10:16 PM.   I have reviewed the triage vital signs and the nursing notes.   HISTORY  Chief Complaint Abdominal Pain   HPI Pamela [name] is a 60 y.o. female with PMHx of DM and HTN complaining of lower cramping abdominal pain that began earlier today. She reports associated nausea and an episode of emesis. She reports having hard stools and used an enema this evening which increased the abdominal cramping. She reports seeing bright red blood in the toilet and toilet tissue after her bowel movements. She reports three large bowel movements after using the enema. She has not taken anything for pain. She denies any modifying factors. She denies CP, SOB, dysuria. She reports an abdominal hysterectomy last month, 08/20/16.  Past Medical History:  Diagnosis Date  . Diabetes (HCC)   . Heart valve problem   . Hypertension     There are no active problems to display for this patient.   Past Surgical History:  Procedure Laterality Date  . ABDOMINAL HYSTERECTOMY    . APPENDECTOMY    . BREAST LUMPECTOMY    . TUBAL LIGATION      Current Outpatient Rx  . Order #: [id number] Class: Historical Med  . Order #: [id number] Class: Historical Med  . Order #: [id number] Class: Historical Med  . Order #: [id number] Class: Historical Med  . Order #: [id number] Class: Historical Med  . Order #: [id number] Class: Historical Med  . Order #: [id number] Class: Historical Med  . Order #: [id number] Class: Historical Med  . Order #: [id number] Class: Historical Med  . Order #: [id number] Class: Historical Med  . Order #: [id number] Class: Historical Med  . Order #: [id number] Class: Historical Med  . Order #: [id number] Class: Historical Med  . Order #: [id number] Class:  Historical Med  . Order #: [id number] Class: Historical Med  . Order #: [id number] Class: Historical Med  . Order #: [id number] Class: Historical Med  . Order #: [id number] Class: Historical Med  . Order #: [id number] Class: Historical Med  . Order #: [id number] Class: Print  . Order #: [id number] Class: Print  . Order #: [id number] Class: Print    Allergies Aspirin; Bee venom; and Penicillins  History reviewed. No pertinent family history.  Social History Social History  Substance Use Topics  . Smoking status: Never Smoker  . Smokeless tobacco: Never Used  . Alcohol use No    Review of Systems  Constitutional: No fever/chills Eyes: No visual changes. ENT: No sore throat. Cardiovascular: Denies chest pain. Respiratory: Denies shortness of breath. Gastrointestinal: Abdominal pain. Constipation. Nausea. Vomiting x 1. BRB in toilet after large BM.  No diarrhea. Genitourinary: Negative for dysuria. Musculoskeletal: Negative for back pain. Skin: Negative for rash. Neurological: Negative for headaches, focal weakness or numbness.  10-point ROS otherwise negative.  ____________________________________________   PHYSICAL EXAM:  VITAL SIGNS: Temp: 98.7 F Resp: 16 SpO2: 93% BP: 172/81  Constitutional: Alert and oriented. Appears somewhat uncomfortable with grabbing at stomach and intermittent moaning.  Eyes: Conjunctivae are normal.  Head: Atraumatic. Nose: No congestion/rhinnorhea. Mouth/Throat: Mucous membranes are moist.  Oropharynx non-erythematous. Neck: No stridor.  Cardiovascular: Normal rate, regular rhythm. Good peripheral circulation. Grossly normal heart sounds.   Respiratory: Normal respiratory effort.  No retractions. Lungs CTAB. Gastrointestinal: Soft with mild periumbilical tenderness. No distention.  Musculoskeletal: No lower extremity tenderness nor edema. No  gross deformities of extremities. Neurologic:  Normal speech and language. No gross focal neurologic deficits  are appreciated.  Skin:  Skin is warm, dry and intact. No rash noted.  ____________________________________________   LABS (all labs ordered are listed, but only abnormal results are displayed)  Labs Reviewed  COMPREHENSIVE METABOLIC PANEL - Abnormal; Notable for the following:       Result Value   Glucose, Bld 179 (*)    BUN 21 (*)    All other components within normal limits  CBC - Abnormal; Notable for the following:    RBC 3.72 (*)    Hemoglobin 10.9 (*)    HCT 33.9 (*)    All other components within normal limits  URINALYSIS, ROUTINE W REFLEX MICROSCOPIC - Abnormal; Notable for the following:    Ketones, ur 20 (*)    All other components within normal limits  POC OCCULT BLOOD, ED - Abnormal; Notable for the following:    Fecal Occult Bld POSITIVE (*)    All other components within normal limits  LIPASE, BLOOD   ____________________________________________  RADIOLOGY  Ct Abdomen Pelvis W Contrast  Result Date: 09/28/2016 CLINICAL DATA:  60 year old female with abdominal pain. Nausea and vomiting. EXAM: CT ABDOMEN AND PELVIS WITH CONTRAST TECHNIQUE: Multidetector CT imaging of the abdomen and pelvis was performed using the standard protocol following bolus administration of intravenous contrast. CONTRAST:  ISOVUE-300 IOPAMIDOL (ISOVUE-300) INJECTION 61% COMPARISON:  None. FINDINGS: Lower chest: The visualized lung bases are clear. No intra-abdominal free air. Trace free fluid noted within the pelvis. Hepatobiliary: Apparent mild fatty infiltration of the liver. No intrahepatic biliary ductal dilatation. The gallbladder is unremarkable. Pancreas: Unremarkable. No pancreatic ductal dilatation or surrounding inflammatory changes. Spleen: Normal in size without focal abnormality. Adrenals/Urinary Tract: Adrenal glands are unremarkable. Kidneys are normal, without renal calculi, focal lesion, or hydronephrosis. Bladder is unremarkable. Stomach/Bowel: There is inflamed and  thickened appearance of the transverse and descending colon compatible with colitis. The pattern of involvement is more consistent with an infectious colitis. An inflammatory bowel disease is less likely as the terminal ileum and rectum are spared. Correlation with clinical exam and stool cultures recommended. Loose stool noted within the colon compatible with diarrheal state. There is no evidence of bowel obstruction. Appendectomy. Vascular/Lymphatic: No significant vascular findings are present. No enlarged abdominal or pelvic lymph nodes. Reproductive: Hysterectomy. Other: Small fat containing umbilical hernia. Musculoskeletal: Degenerative changes of the spine. L4-L5 disc desiccation with vacuum phenomena. 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 No acute fracture. IMPRESSION: Colitis predominantly involving the transverse and descending colon most likely infectious in etiology. Correlation with clinical exam and stool cultures recommended. No bowel obstruction. Electronically Signed   By: Elgie Collard M.D.   On: 09/28/2016 23:57    ____________________________________________   PROCEDURES  Procedure(s) performed:   Procedures  None ____________________________________________   INITIAL IMPRESSION / ASSESSMENT AND PLAN / ED COURSE  Pertinent labs & imaging results that were available during my care of the patient were reviewed by me and considered in my medical decision making (see chart for details).  Patient resents the emergency department for evaluation of constipation (now resolved), blood in toilet after large bowel movement, diffuse abdominal pain made worse since the enema use, and nausea/vomiting. Patient's abdomen is mildly tender in the periumbilical region. No fevers or chills here. She does seem uncomfortable and has had vomiting since arrival in the emergency department. No chest pain or difficulty breathing. Oxygen saturation is normal. Rectal exam is negative for  melena or  gross blood. No impacted stool. Plan for CT scan given the patient's age and level of discomfort on arrival. She did have surgery approximate one month ago for hysterectomy but with multiple large bowel movements PTA she has no clinical signs of SBO.   11:18 PM On reassessment the patient is having active vomiting while attempting to drink contrast for CT. Will provide additional nausea medication and follow CT scan. No other findings on labs with the exception of being hemoccult positive.   12:05 AM Patient with evidence of colitis on CT which fits well clinically. No fever. No leukocytosis. No significantly abnormal vital signs. She is feeling much better after Phenergan. I discussed that sometimes this requires hospital admission but often this is treated at home.The patient reports that she is feeling better and would like to at least try to take antibiotics and stay hydrated at home as opposed to being admitted. This seems reasonable in this clinical setting. Discussed strict return precautions and hydration strategy at home in detail with the patient and husband.   At this time, I do not feel there is any life-threatening condition present. I have reviewed and discussed all results (EKG, imaging, lab, urine as appropriate), exam findings with patient. I have reviewed nursing notes and appropriate previous records.  I feel the patient is safe to be discharged home without further emergent workup. Discussed usual and customary return precautions. Patient and family (if present) verbalize understanding and are comfortable with this plan.  Patient will follow-up with their primary care provider. If they do not have a primary care provider, information for follow-up has been provided to them. All questions have been answered.  ____________________________________________  FINAL CLINICAL IMPRESSION(S) / ED DIAGNOSES  Final diagnoses:  Colitis  Generalized abdominal pain  Non-intractable vomiting  with nausea, unspecified vomiting type     MEDICATIONS GIVEN DURING THIS VISIT:  Medications  promethazine (PHENERGAN) injection 12.5 mg (not administered)  ciprofloxacin (CIPRO) tablet 500 mg (not administered)  metroNIDAZOLE (FLAGYL) tablet 500 mg (not administered)  sodium chloride 0.9 % bolus 500 mL (500 mLs Intravenous New Bag/Given 09/28/16 2227)  morphine 4 MG/ML injection 4 mg (4 mg Intravenous Given 09/28/16 2227)  ondansetron (ZOFRAN) injection 4 mg (4 mg Intravenous Given 09/28/16 2227)  iopamidol (ISOVUE-300) 61 % injection (100 mLs  Contrast Given 09/28/16 2323)     NEW OUTPATIENT MEDICATIONS STARTED DURING THIS VISIT:  New Prescriptions   CIPROFLOXACIN (CIPRO) 500 MG TABLET    Take 1 tablet (500 mg total) by mouth 2 (two) times daily.   METRONIDAZOLE (FLAGYL) 500 MG TABLET    Take 1 tablet (500 mg total) by mouth 2 (two) times daily.   PROMETHAZINE (PHENERGAN) 25 MG TABLET    Take 1 tablet (25 mg total) by mouth every 6 (six) hours as needed for nausea or vomiting.    I personally performed the services described in this documentation, which was scribed in my presence. The recorded information has been reviewed and is accurate.    Note:  This document was prepared using Dragon voice recognition software and may include unintentional dictation errors.  Alona BeneJoshua Gabrial Domine, MD Emergency Medicine    Maia PlanJoshua G Zlata Alcaide, MD 09/29/16 [phone]

## 2016-09-28 NOTE — ED Triage Notes (Signed)
Pt states she felt constipated and had an enema this evening, when having BM pt states blood on tissue and in commode. Denies hx of hemorrhoids. States abd pain, cramping, N/, V/ X1, denies fever.

## 2016-09-29 MED ORDER — METRONIDAZOLE 500 MG PO TABS
500.0000 mg | ORAL_TABLET | Freq: Once | ORAL | Status: AC
  Administered 2016-09-29: 500 mg via ORAL
  Filled 2016-09-29: qty 1

## 2016-09-29 MED ORDER — CIPROFLOXACIN HCL 500 MG PO TABS: 500.0000 mg | ORAL_TABLET | Freq: Two times a day (BID) | ORAL | 0 refills | Status: AC

## 2016-09-29 MED ORDER — METRONIDAZOLE 500 MG PO TABS: 500.0000 mg | ORAL_TABLET | Freq: Two times a day (BID) | ORAL | 0 refills | Status: AC

## 2016-09-29 MED ORDER — CIPROFLOXACIN HCL 250 MG PO TABS
500.0000 mg | ORAL_TABLET | Freq: Once | ORAL | Status: AC
  Administered 2016-09-29: 500 mg via ORAL
  Filled 2016-09-29: qty 2

## 2016-09-29 MED ORDER — PROMETHAZINE HCL 25 MG PO TABS: 25.0000 mg | ORAL_TABLET | Freq: Four times a day (QID) | ORAL | 0 refills | Status: DC | PRN

## 2016-09-29 MED ORDER — TRAMADOL HCL 50 MG PO TABS: 50.0000 mg | ORAL_TABLET | Freq: Four times a day (QID) | ORAL | 0 refills | Status: AC | PRN

## 2016-09-29 NOTE — Discharge Instructions (Signed)
We believe your symptoms are caused by colitis. Most of the time this condition (please read through the included information) can be cured with outpatient antibiotics.  Please take the full course of prescribed medication(s) and follow up with the doctors recommended above.  Return to the ED if your abdominal pain worsens or fails to improve, you develop bloody vomiting, bloody diarrhea, you are unable to tolerate fluids due to vomiting, fever greater than 101, or other symptoms that concern you.  Take tramadol as prescribed for severe pain. Do not drink alcohol, drive or participate in any other potentially dangerous activities while taking this medication as it may make you sleepy. Do not take this medication with any other sedating medications, either prescription or over-the-counter. If you were prescribed Percocet or Vicodin, do not take these with acetaminophen (Tylenol) as it is already contained within these medications.   This medication is an opiate (or narcotic) pain medication and can be habit forming.  Use it as little as possible to achieve adequate pain control.  Do not use or use it with extreme caution if you have a history of opiate abuse or dependence.  If you are on a pain contract with your primary care doctor or a pain specialist, be sure to let them know you were prescribed this medication today from the [facility].  This medication is intended for your use only - do not give any to anyone else and keep it in a secure place where nobody else, especially children, have access to it.  It will also cause or worsen constipation, so you may want to consider taking an over-the-counter stool softener while you are taking this medication.

## 2017-01-12 IMAGING — CT CT NECK W/ CM
3 of 4 series · 13 of 33 positions shown, 16 images · IV contrast (Omnipaque 300)
Comparison: None.

CLINICAL DATA: Initial valuation for 10 days of acute sore throat.

EXAM:
CT NECK WITH CONTRAST
TECHNIQUE: Multidetector CT imaging of the neck was performed using the
standard protocol following the bolus administration of intravenous
contrast.
CONTRAST:  75mL OMNIPAQUE IOHEXOL 300 MG/ML  SOLN

[Series 2: soft tissue neck 2.0 b31s · axial · 0.54mm/px · z∈[+48,+208]mm · 5 of 122 slices shown, 7 images]
[im 21/122  soft-tissue]
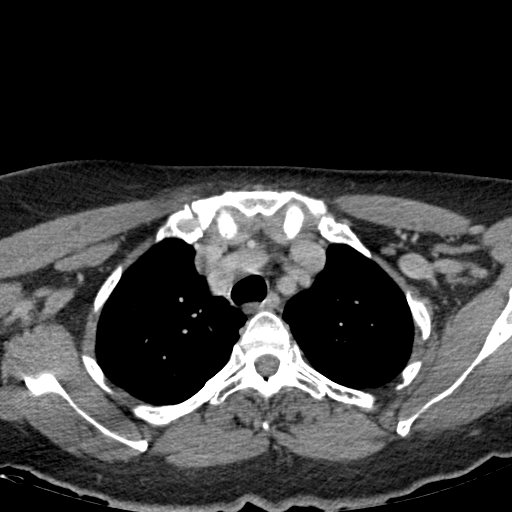
[im 21/122  bone]
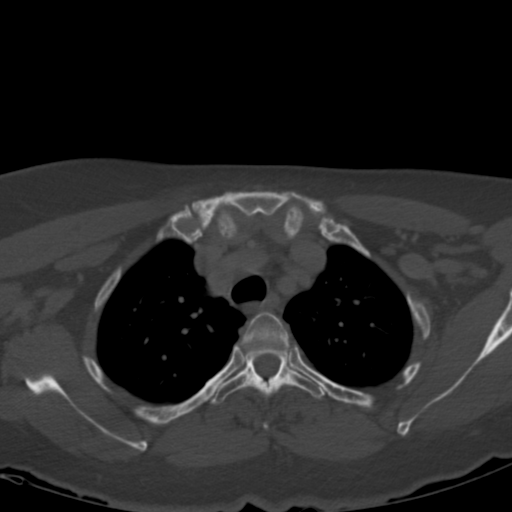
[im 41/122  bone]
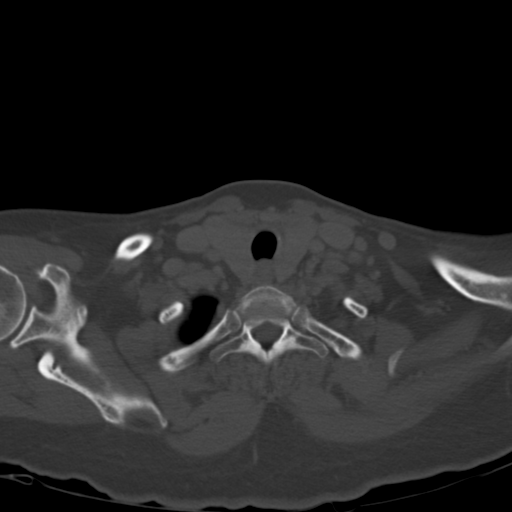
[im 61/122  bone]
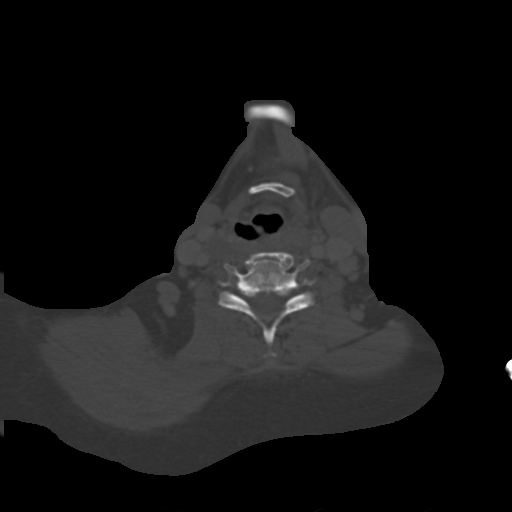
[im 81/122  bone]
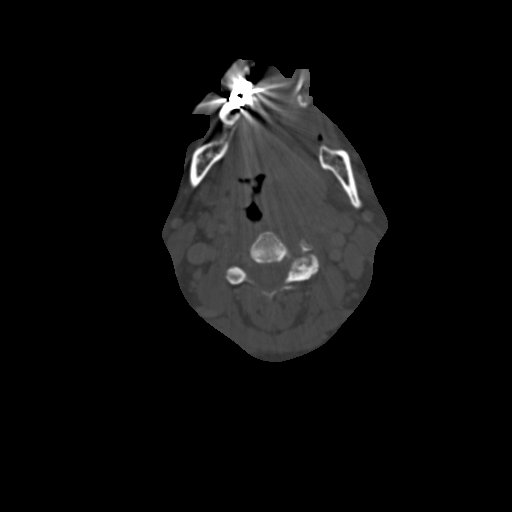
[im 101/122  soft-tissue]
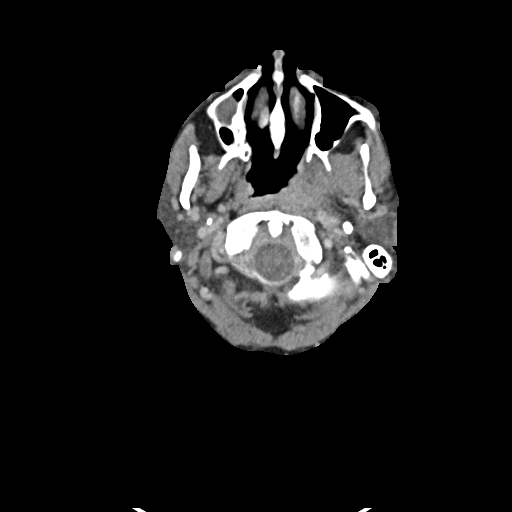
[im 101/122  bone]
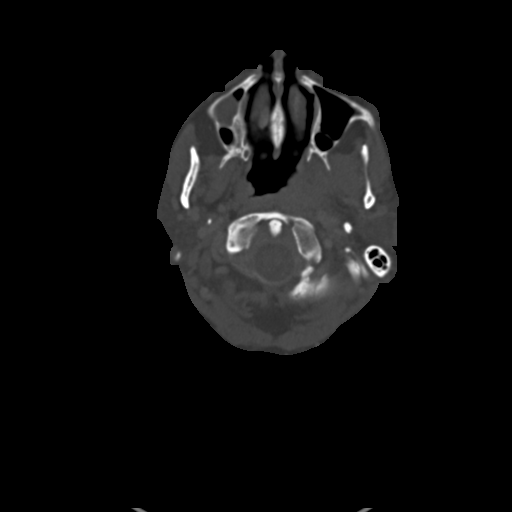

[Series 4: neck 2.0 soft tissue sag · sagittal · 0.48mm/px · 5 of 102 slices shown, 6 images]
[im 34/102  bone]
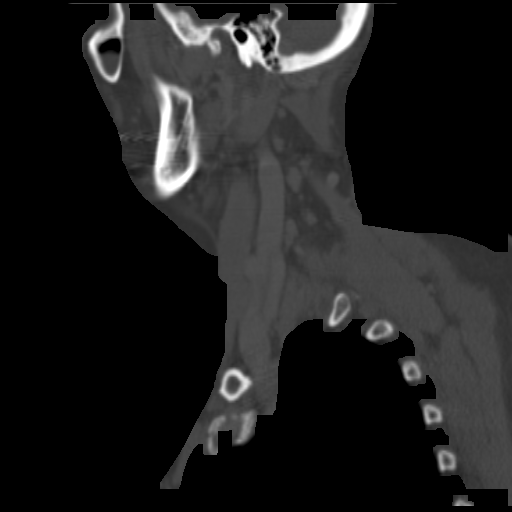
[im 43/102  bone]
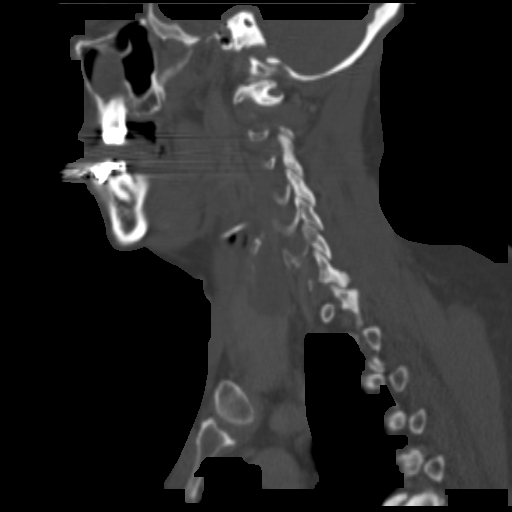
[im 51/102  soft-tissue]
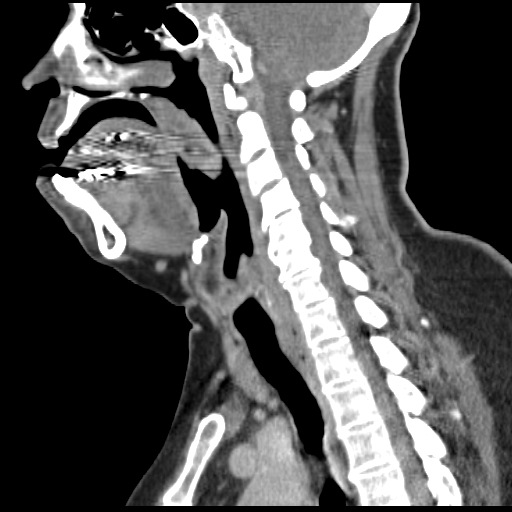
[im 51/102  bone]
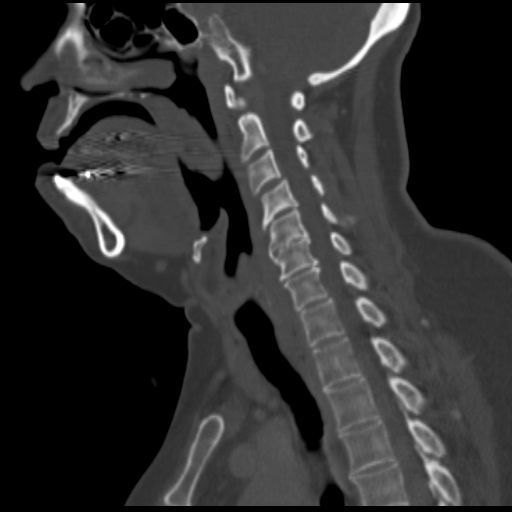
[im 59/102  bone]
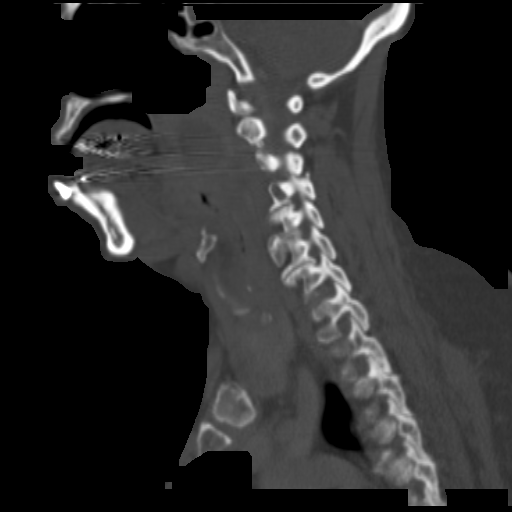
[im 68/102  bone]
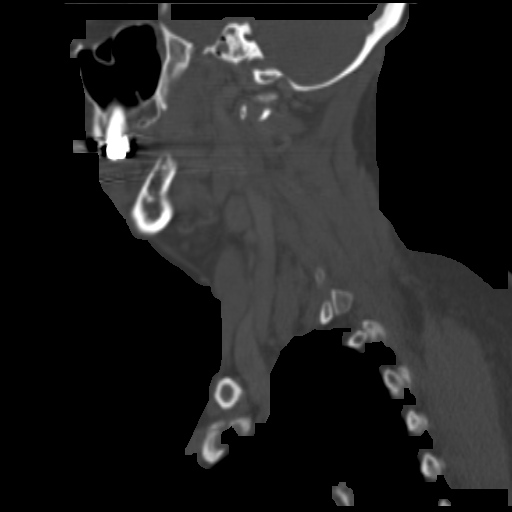

[Series 5: neck 2.0 soft tissue coro · coronal · 0.47mm/px · 3 of 126 slices shown]
[im 26/126  bone]
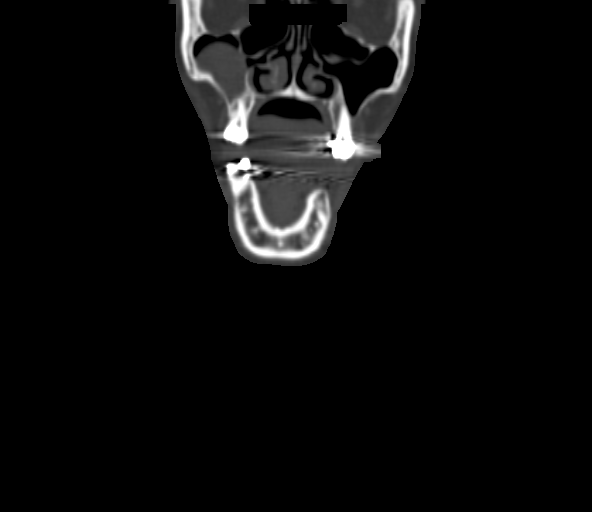
[im 51/126  bone]
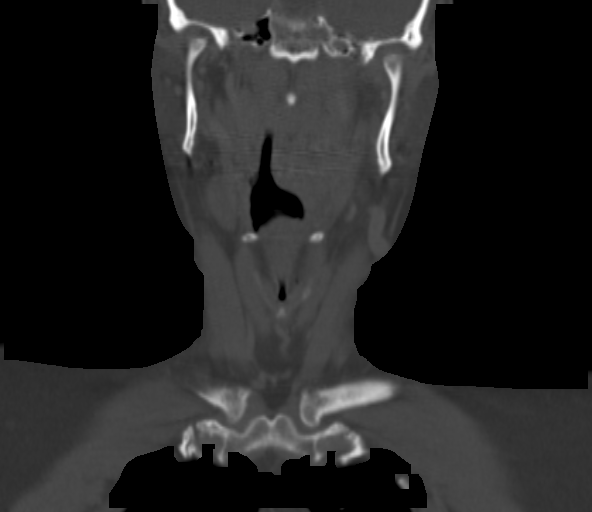
[im 76/126  bone]
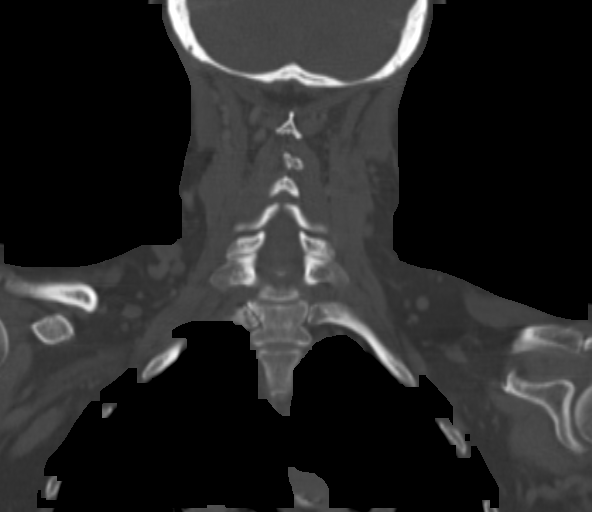

[13 of 33 positions shown; findings below may reference images not displayed]

FINDINGS: Visualized portions of the brain demonstrate no acute process.
Partially visualized globes and orbits within normal limits.

Retention cyst noted within the right maxillary sinus. Minimal
opacity within the ethmoidal air cells. Visualized paranasal sinuses
are otherwise clear. Visualized mastoids are well pneumatized.
Middle ear cavities are clear.

Salivary glands including the parotid glands and submandibular
glands are within normal limits.

Oral cavity demonstrates no definite acute abnormality, although
evaluation somewhat limited by streak artifact from dental amalgam.
No acute abnormality about the remaining dentition.

Large left tonsillar/peritonsillar abscess is present, measuring
x 3.3 x 2.8 cm (series 2, image 35). Left tonsil is enlarged and
deviated towards the midline, with a tonsils abutting at the
midline. Associated mucosal and submucosal edema within the adjacent
oropharynx extending down the left hypopharynx. Epiglottis is
normal. Vallecula is clear. No retropharyngeal fluid collection.
Left piriform sinus partially effaced by a the pharyngeal edema.
Right piriform sinus normal. True cords within normal limits.
Subglottic airway is clear. Increased number of left level [DATE] nodes
which measure up to 11 mm, likely reactive. There is an 11 mm right
supraclavicular lymph node (series 2, image 67), indeterminate.

2.7 cm hypodense nodule within the posterior right thyroid lobe,
indeterminate. Thyroid otherwise unremarkable.

Visualized superior mediastinum within normal limits. Visualized
lungs are clear.

Normal intravascular enhancement seen throughout the neck.

No acute osseous abnormality. No worrisome lytic or blastic osseous
lesions. Moderate degenerative spondylolysis at C5-6 and C6-7.
IMPRESSION: 1. 2.4 x 3.3 x 2.8 cm left tonsillar/ peritonsillar abscess. There
is mass effect on the adjacent or pharyngeal airway which is
narrowed but remains patent. Associated pharyngeal edema within the
left pharynx, consistent with associated pharyngitis.
2. Increased number of mildly prominent left-sided cervical lymph
nodes, likely reactive.
3. 11 mm right supraclavicular lymph node, indeterminate, and of
uncertain significance. Clinical followup to resolution recommended.
4. 2.7 cm hypodense right superior pole thyroid nodule,
indeterminate. Further evaluation with dedicated thyroid ultrasound
recommended. This could be performed on a nonemergent basis.

## 2018-07-22 ENCOUNTER — Emergency Department (HOSPITAL_COMMUNITY): Payer: BLUE CROSS/BLUE SHIELD

## 2018-07-22 ENCOUNTER — Other Ambulatory Visit: Payer: Self-pay

## 2018-07-22 ENCOUNTER — Emergency Department (HOSPITAL_COMMUNITY)
Admission: EM | Admit: 2018-07-22 | Discharge: 2018-07-22 | Disposition: A | Discharge status: Home / Self Care | Payer: BLUE CROSS/BLUE SHIELD | Attending: Emergency Medicine | Admitting: Emergency Medicine

## 2018-07-22 ENCOUNTER — Encounter (HOSPITAL_COMMUNITY): Payer: Self-pay

## 2018-07-22 DIAGNOSIS — Z7982 Long term (current) use of aspirin: Secondary | ICD-10-CM | POA: Insufficient documentation

## 2018-07-22 DIAGNOSIS — Z79899 Other long term (current) drug therapy: Secondary | ICD-10-CM | POA: Diagnosis not present

## 2018-07-22 DIAGNOSIS — E119 Type 2 diabetes mellitus without complications: Secondary | ICD-10-CM | POA: Insufficient documentation

## 2018-07-22 DIAGNOSIS — R079 Chest pain, unspecified: Secondary | ICD-10-CM | POA: Diagnosis present

## 2018-07-22 DIAGNOSIS — Z794 Long term (current) use of insulin: Secondary | ICD-10-CM | POA: Diagnosis not present

## 2018-07-22 DIAGNOSIS — I10 Essential (primary) hypertension: Secondary | ICD-10-CM | POA: Diagnosis not present

## 2018-07-22 LAB — CBC
HEMATOCRIT: 38.2 % (ref 36.0–46.0)
Hemoglobin: 11.5 g/dL — ABNORMAL LOW (ref 12.0–15.0)
MCH: 27.4 pg (ref 26.0–34.0)
MCHC: 30.1 g/dL (ref 30.0–36.0)
MCV: 91 fL (ref 80.0–100.0)
Platelets: 284 10*3/uL (ref 150–400)
RBC: 4.2 MIL/uL (ref 3.87–5.11)
RDW: 12.3 % (ref 11.5–15.5)
WBC: 6.3 10*3/uL (ref 4.0–10.5)
nRBC: 0 % (ref 0.0–0.2)

## 2018-07-22 LAB — BASIC METABOLIC PANEL
Anion gap: 7 (ref 5–15)
BUN: 16 mg/dL (ref 8–23)
CALCIUM: 9.5 mg/dL (ref 8.9–10.3)
CO2: 27 mmol/L (ref 22–32)
CREATININE: 0.9 mg/dL (ref 0.44–1.00)
Chloride: 102 mmol/L (ref 98–111)
GFR calc Af Amer: >60 mL/min (ref >60)
Glucose, Bld: 130 mg/dL — ABNORMAL HIGH (ref 70–99)
Potassium: 3.8 mmol/L (ref 3.5–5.1)
SODIUM: 136 mmol/L (ref 135–145)

## 2018-07-22 LAB — TROPONIN I

## 2018-07-22 MED ORDER — MORPHINE SULFATE (PF) 4 MG/ML IV SOLN
4.0000 mg | Freq: Once | INTRAVENOUS | Status: AC
  Administered 2018-07-22: 4 mg via INTRAVENOUS
  Filled 2018-07-22: qty 1

## 2018-07-22 MED ORDER — HYDROCODONE-ACETAMINOPHEN 5-325 MG PO TABS
1.0000 | ORAL_TABLET | ORAL | Status: AC
  Administered 2018-07-22: 1 via ORAL
  Filled 2018-07-22: qty 1

## 2018-07-22 MED ORDER — PANTOPRAZOLE SODIUM 20 MG PO TBEC: 20.0000 mg | DELAYED_RELEASE_TABLET | Freq: Every day | ORAL | 0 refills | Status: AC

## 2018-07-22 NOTE — ED Provider Notes (Signed)
[facility] EMERGENCY DEPARTMENT Provider Note   CSN: [id number] Arrival date & time: 07/22/18  1306     History   Chief Complaint Chief Complaint  Patient presents with  . Chest Pain    HPI Pamela [name] is a 62 y.o. female.  HPI Patient presented to the emergency room for evaluation of chest pain.  Patient states the symptoms started about 10 AM while she was driving her car..  She describes it as a pressure discomfort in the center of her chest.  It feels like a heaviness.  It has been pretty constant since about 10 AM.  She denies any trouble with nausea or vomiting.  No shortness of breath.  She has some discomfort in her left arm but that has been attributed to a pinched nerve and has been ongoing for a while. she is currently getting physical therapy for that.  She does not have any history of heart disease.  No history of PE T.  No family history of early heart disease. Past Medical History:  Diagnosis Date  . Diabetes (HCC)   . Heart valve problem   . Hypertension     There are no active problems to display for this patient.   Past Surgical History:  Procedure Laterality Date  . ABDOMINAL HYSTERECTOMY    . APPENDECTOMY    . BREAST LUMPECTOMY    . TUBAL LIGATION       OB History   None      Home Medications    Prior to Admission medications   Medication Sig Start Date End Date Taking? Authorizing Provider  amLODipine (NORVASC) 2.5 MG tablet Take 2.5 mg by mouth daily. 11/13/15   [provider]  aspirin 325 MG EC tablet Take 325 mg by mouth daily.    [provider]  benzonatate (TESSALON) 200 MG capsule Take 1 capsule by mouth 3 (three) times daily as needed for cough.  11/17/15   [provider]  Cholecalciferol (VITAMIN D3) 2000 units TABS Take 1 capsule by mouth daily.    [provider]  gabapentin (NEURONTIN) 300 MG capsule Take 300-600 mg by mouth daily as needed (for pain).     [provider]    hydrochlorothiazide (HYDRODIURIL) 25 MG tablet Take 25 mg by mouth every morning.    [provider]  ibuprofen (ADVIL,MOTRIN) 200 MG tablet Take 400 mg by mouth every 6 (six) hours as needed for fever or moderate pain.    [provider]  Liraglutide (VICTOZA) 18 MG/3ML SOPN Inject 1.8 mg into the skin daily.    [provider]  lisinopril (PRINIVIL,ZESTRIL) 20 MG tablet Take 20 mg by mouth daily. 02/14/15   [provider]  metFORMIN (GLUCOPHAGE) 500 MG tablet Take 500 mg by mouth 2 (two) times daily with a meal.    [provider]  mineral oil enema Place 1 enema rectally once.    [provider]  Multiple Vitamin (MULTIVITAMIN WITH MINERALS) TABS tablet Take 1 tablet by mouth daily.    [provider]  naproxen (NAPROSYN) 500 MG tablet Take 500 mg by mouth daily as needed for moderate pain.     [provider]  nitroGLYCERIN (NITROSTAT) 0.4 MG SL tablet Place 0.4 mg under the tongue every 5 (five) minutes x 3 doses as needed. For chest pain    [provider]  Omega-3 Fatty Acids (FISH OIL) 1000 MG CAPS Take 1 capsule by mouth daily.    [provider]  omeprazole (PRILOSEC) 20 MG capsule Take 20 mg by mouth daily. 02/24/15   [provider]  pantoprazole (PROTONIX) 20 MG tablet Take 1 tablet (20 mg total) by mouth daily. 07/22/18   Linwood Dibbles, MD  pravastatin (PRAVACHOL) 40 MG tablet Take 20 mg by mouth at bedtime.     [provider]  promethazine (PHENERGAN) 25 MG tablet Take 1 tablet (25 mg total) by mouth every 6 (six) hours as needed for nausea or vomiting. 09/29/16   Long, Arlyss Repress, MD  TOUJEO SOLOSTAR 300 UNIT/ML SOPN INJECT 10 UNITS SUBQ AT BEDTIME 09/24/16   [provider]  traMADol (ULTRAM) 50 MG tablet Take 50 mg by mouth every 4 (four) hours as needed for moderate pain.  11/07/15   [provider]  traMADol (ULTRAM) 50 MG tablet Take 1 tablet (50 mg total) by  mouth every 6 (six) hours as needed. 09/29/16   Long, Arlyss Repress, MD    Family History No family history on file.  Social History Social History   Tobacco Use  . Smoking status: Never Smoker  . Smokeless tobacco: Never Used  Substance Use Topics  . Alcohol use: No  . Drug use: No     Allergies   Aspirin; Bee venom; and Penicillins   Review of Systems Review of Systems  All other systems reviewed and are negative.    Physical Exam Updated Vital Signs BP (!) 146/109   Pulse 79   Resp 17   Ht 1.626 m (5\' 4" )   Wt 100.7 kg   SpO2 97%   BMI 38.11 kg/m   Physical Exam  Constitutional: She appears well-developed and well-nourished.  Non-toxic appearance.  HENT:  Head: Normocephalic and atraumatic.  Right Ear: External ear normal.  Left Ear: External ear normal.  Eyes: Conjunctivae are normal. Right eye exhibits no discharge. Left eye exhibits no discharge. No scleral icterus.  Neck: Neck supple. No tracheal deviation present.  Cardiovascular: Normal rate, regular rhythm and intact distal pulses.  Pulmonary/Chest: Effort normal and breath sounds normal. No stridor. No respiratory distress. She has no wheezes. She has no rales.  Abdominal: Soft. Bowel sounds are normal. She exhibits no distension. There is no tenderness. There is no rebound and no guarding.  Musculoskeletal: She exhibits no edema or tenderness.       Right lower leg: She exhibits no tenderness and no edema.       Left lower leg: She exhibits no tenderness and no edema.  Neurological: She is alert. She has normal strength. No cranial nerve deficit (no facial droop, extraocular movements intact, no slurred speech) or sensory deficit. She exhibits normal muscle tone. She displays no seizure activity. Coordination normal.  Skin: Skin is warm and dry. No rash noted.  Psychiatric: She has a normal mood and affect.  Nursing note and vitals reviewed.    ED Treatments / Results  Labs (all labs ordered are  listed, but only abnormal results are displayed) Labs Reviewed  BASIC METABOLIC PANEL - Abnormal; Notable for the following components:      Result Value   Glucose, Bld 130 (*)    All other components within normal limits  CBC - Abnormal; Notable for the following components:   Hemoglobin 11.5 (*)    All other components within normal limits  TROPONIN I  TROPONIN I    EKG EKG Interpretation  Date/Time:  Wednesday July 22 2018 13:18:34 EDT Ventricular Rate:  103 PR Interval:  QRS Duration: 81 QT Interval:  330 QTC Calculation: 432 R Axis:   58 Text Interpretation:  Sinus tachycardia Atrial premature complex No significant change since last tracing Confirmed by Linwood Dibbles [phone]) on 07/22/2018 1:31:37 PM   Radiology Dg Chest 2 View  Result Date: 07/22/2018 CLINICAL DATA:  Mid to left-sided chest pain this morning. EXAM: CHEST - 2 VIEW COMPARISON:  12/26/2015 FINDINGS: The heart size and mediastinal contours are within normal limits. Mild atherosclerosis of the nonaneurysmal aorta. Both lungs are clear. The visualized skeletal structures are unremarkable. IMPRESSION: No active cardiopulmonary disease. Electronically Signed   By: Tollie Eth M.D.   On: 07/22/2018 14:35    Procedures Procedures (including critical care time)  Medications Ordered in ED Medications  HYDROcodone-acetaminophen (NORCO/VICODIN) 5-325 MG per tablet 1 tablet (has no administration in time range)  morphine 4 MG/ML injection 4 mg (4 mg Intravenous Given 07/22/18 1350)     Initial Impression / Assessment and Plan / ED Course  I have reviewed the triage vital signs and the nursing notes.  Pertinent labs & imaging results that were available during my care of the patient were reviewed by me and considered in my medical decision making (see chart for details).  Clinical Course as of Jul 22 1730  Wed Jul 22, 2018  1615 Overall low risk heart score of 3.   [JK]    Clinical Course User Index [JK]  Linwood Dibbles, MD   Patient presented to the emergency room for evaluation of chest discomfort.  Patient is overall low risk heart score category.  Serial troponins and EKG were normal.  I have low suspicion for acute coronary syndrome.  No PE risk factors and symptoms are not suggestive of pulmonary embolism.  No findings to suggest aortic aneurysm, dissection. I think she is stable for outpatient follow-up.  Consider stress testing patient states she had a normal stress test about a year ago.  We will have her follow-up with her primary care doctor.  Symptoms may be related to esophageal reflux.  I will have her try taking some antacids  Patient also has been having trouble with arm pain but this preceded her chest pain and had been attributed to a pinched nerve.  Patient is scheduled for therapy.  I will have her continue with that current plan  Final Clinical Impressions(s) / ED Diagnoses   Final diagnoses:  Chest pain, unspecified type    ED Discharge Orders         Ordered    pantoprazole (PROTONIX) 20 MG tablet  Daily     07/22/18 1727           Linwood Dibbles, MD 07/22/18 1731

## 2018-07-22 NOTE — ED Notes (Signed)
Pt states pain is much better in chest, but arm still hurts

## 2018-07-22 NOTE — ED Triage Notes (Signed)
Pt brought to ED for chest pain which started this morning. Pt states the pain started at 1000 in the mid chest and feels like heaviness in the mid chest. Pt denies weakness, N/V, or SOB. Pt states she has had some left arm pain from a pinched nerve in her neck for the past month and has been in physical therapy for.

## 2018-07-22 NOTE — ED Notes (Signed)
Pt leaving with sister driving.

## 2018-07-22 NOTE — Discharge Instructions (Addendum)
Continue your current treatment for the pinched nerve, take the antacid medication as prescribed, follow-up with your doctor later this week to be rechecked, consider seeing a cardiologist for further evaluation such as stress testing, return to the emergency room as needed for worsening symptoms

## 2019-04-28 ENCOUNTER — Emergency Department (HOSPITAL_COMMUNITY)
Admission: EM | Admit: 2019-04-28 | Discharge: 2019-04-28 | Disposition: A | Discharge status: Home / Self Care | Payer: BC Managed Care – PPO | Attending: Emergency Medicine | Admitting: Emergency Medicine

## 2019-04-28 ENCOUNTER — Other Ambulatory Visit: Payer: Self-pay

## 2019-04-28 ENCOUNTER — Encounter (HOSPITAL_COMMUNITY): Payer: Self-pay | Admitting: Emergency Medicine

## 2019-04-28 DIAGNOSIS — Z79899 Other long term (current) drug therapy: Secondary | ICD-10-CM | POA: Diagnosis not present

## 2019-04-28 DIAGNOSIS — N390 Urinary tract infection, site not specified: Secondary | ICD-10-CM

## 2019-04-28 DIAGNOSIS — E1165 Type 2 diabetes mellitus with hyperglycemia: Secondary | ICD-10-CM | POA: Diagnosis present

## 2019-04-28 LAB — CBC
HCT: 38.4 % (ref 36.0–46.0)
Hemoglobin: 11.7 g/dL — ABNORMAL LOW (ref 12.0–15.0)
MCH: 27.1 pg (ref 26.0–34.0)
MCHC: 30.5 g/dL (ref 30.0–36.0)
MCV: 89.1 fL (ref 80.0–100.0)
Platelets: 290 10*3/uL (ref 150–400)
RBC: 4.31 MIL/uL (ref 3.87–5.11)
RDW: 11.8 % (ref 11.5–15.5)
WBC: 8.6 10*3/uL (ref 4.0–10.5)
nRBC: 0 % (ref 0.0–0.2)

## 2019-04-28 LAB — BASIC METABOLIC PANEL
Anion gap: 11 (ref 5–15)
BUN: 18 mg/dL (ref 8–23)
CO2: 29 mmol/L (ref 22–32)
Calcium: 9.2 mg/dL (ref 8.9–10.3)
Chloride: 92 mmol/L — ABNORMAL LOW (ref 98–111)
Creatinine, Ser: 1.12 mg/dL — ABNORMAL HIGH (ref 0.44–1.00)
GFR calc Af Amer: >60 mL/min (ref >60)
GFR calc non Af Amer: 52 mL/min — ABNORMAL LOW (ref >60)
Glucose, Bld: 339 mg/dL — ABNORMAL HIGH (ref 70–99)
Potassium: 3.8 mmol/L (ref 3.5–5.1)
Sodium: 132 mmol/L — ABNORMAL LOW (ref 135–145)

## 2019-04-28 LAB — URINALYSIS, ROUTINE W REFLEX MICROSCOPIC
Bilirubin Urine: NEGATIVE
Glucose, UA: >=500 mg/dL — AB
Ketones, ur: 5 mg/dL — AB
Nitrite: NEGATIVE
Protein, ur: 30 mg/dL — AB
Specific Gravity, Urine: 1.02 (ref 1.005–1.030)
WBC, UA: >50 WBC/hpf — ABNORMAL HIGH (ref 0–5)
pH: 5 (ref 5.0–8.0)

## 2019-04-28 LAB — CBG MONITORING, ED
Glucose-Capillary: 324 mg/dL — ABNORMAL HIGH (ref 70–99)
Glucose-Capillary: 302 mg/dL — ABNORMAL HIGH (ref 70–99)
Glucose-Capillary: 278 mg/dL — ABNORMAL HIGH (ref 70–99)
Glucose-Capillary: 228 mg/dL — ABNORMAL HIGH (ref 70–99)

## 2019-04-28 LAB — LACTIC ACID, PLASMA: Lactic Acid, Venous: 1.3 mmol/L (ref 0.5–1.9)

## 2019-04-28 MED ORDER — SODIUM CHLORIDE 0.9 % IV BOLUS
500.0000 mL | Freq: Once | INTRAVENOUS | Status: AC
  Administered 2019-04-28: 500 mL via INTRAVENOUS

## 2019-04-28 MED ORDER — ACETAMINOPHEN 325 MG PO TABS
650.0000 mg | ORAL_TABLET | Freq: Once | ORAL | Status: AC
  Administered 2019-04-28: 650 mg via ORAL
  Filled 2019-04-28: qty 2

## 2019-04-28 MED ORDER — SODIUM CHLORIDE 0.9 % IV SOLN
1.0000 g | Freq: Once | INTRAVENOUS | Status: AC
  Administered 2019-04-28: 1 g via INTRAVENOUS
  Filled 2019-04-28: qty 10

## 2019-04-28 MED ORDER — INSULIN ASPART 100 UNIT/ML ~~LOC~~ SOLN
10.0000 [IU] | Freq: Once | SUBCUTANEOUS | Status: AC
  Administered 2019-04-28: 10 [IU] via SUBCUTANEOUS
  Filled 2019-04-28: qty 1

## 2019-04-28 MED ORDER — CEPHALEXIN 500 MG PO CAPS: 500.0000 mg | ORAL_CAPSULE | Freq: Two times a day (BID) | ORAL | 0 refills | Status: AC

## 2019-04-28 MED ORDER — CARVEDILOL 12.5 MG PO TABS
6.2500 mg | ORAL_TABLET | Freq: Once | ORAL | Status: AC
  Administered 2019-04-28: 6.25 mg via ORAL
  Filled 2019-04-28: qty 1

## 2019-04-28 MED ORDER — SODIUM CHLORIDE 0.9 % IV BOLUS
1000.0000 mL | Freq: Once | INTRAVENOUS | Status: AC
  Administered 2019-04-28: 1000 mL via INTRAVENOUS

## 2019-04-28 NOTE — ED Provider Notes (Addendum)
[facility] EMERGENCY DEPARTMENT Provider Note   CSN: [id number] Arrival date & time: 04/28/19  1034    History   Chief Complaint Chief Complaint  Patient presents with  . Hyperglycemia    HPI Pamela [name] is a 63 y.o. female with history of hypertension, diabetes, previous stroke who presents with a one-week history of hyperglycemia.  Patient also reports urinary frequency and darkening of urine.  She reports her blood sugars are normally pretty well controlled, in the mid 100s.  She takes Toujeo once daily as well as Victoza once daily.  She has been taking her medications as prescribed.  Her sugars have persistently been in the upper 300s and low 400s.  She denies any pain, chest pain, shortness of breath, fever, abdominal pain, vomiting.  She has had a little nausea.  Patient reports she gets recurrent urinary tract infections.  She has been taking Macrobid and oxybutynin for this.     HPI  Past Medical History:  Diagnosis Date  . Diabetes ([place])   . Heart valve problem   . Hypertension     There are no active problems to display for this patient.   Past Surgical History:  Procedure Laterality Date  . ABDOMINAL HYSTERECTOMY    . APPENDECTOMY    . BREAST LUMPECTOMY    . TUBAL LIGATION       OB History    Gravida  2   Para  2   Term  2   Preterm      AB      Living        SAB      TAB      Ectopic      Multiple      Live Births               Home Medications    Prior to Admission medications   Medication Sig Start Date End Date Taking? Authorizing Provider  amLODipine (NORVASC) 2.5 MG tablet Take 2.5 mg by mouth daily. 11/13/15  Yes [provider]  atorvastatin (LIPITOR) 40 MG tablet Take 40 mg by mouth daily.   Yes [provider]  BABY ASPIRIN PO Take 1 tablet by mouth daily. 03/06/18  Yes [provider]  carvedilol (COREG) 6.25 MG tablet Take 6.25 mg by mouth 2 (two) times daily with a meal.   Yes [provider]  cetirizine (ZYRTEC) 10 MG tablet Take 10 mg by mouth daily.   Yes [provider]  Cholecalciferol (VITAMIN D3) 2000 units TABS Take 1 capsule by mouth daily.   Yes [provider]  gabapentin (NEURONTIN) 300 MG capsule Take 300-600 mg by mouth daily as needed (for pain).    Yes [provider]  hydrochlorothiazide (HYDRODIURIL) 25 MG tablet Take 25 mg by mouth every morning.   Yes [provider]  ibuprofen (ADVIL,MOTRIN) 200 MG tablet Take 400 mg by mouth every 6 (six) hours as needed for fever or moderate pain.   Yes [provider]  linaclotide (LINZESS) 145 MCG CAPS capsule Take 145 mcg by mouth daily.   Yes [provider]  Liraglutide (VICTOZA) 18 MG/3ML SOPN Inject 1.8 mg into the skin daily.   Yes [provider]  lisinopril (PRINIVIL,ZESTRIL) 20 MG tablet Take 20 mg by mouth daily. 02/14/15  Yes [provider]  metFORMIN (GLUCOPHAGE) 500 MG tablet Take 500 mg by mouth 2 (two) times daily with a meal.   Yes [provider]  Multiple Vitamin (MULTIVITAMIN WITH MINERALS) TABS tablet Take 1 tablet by mouth daily.   Yes [provider]  naproxen (NAPROSYN) 500 MG tablet Take 500 mg by mouth daily as needed for moderate pain.    Yes [provider]  nitrofurantoin (MACRODANTIN) 100 MG capsule Take 100 mg by mouth 2 (two) times a day.   Yes [provider]  nitroGLYCERIN (NITROSTAT) 0.4 MG SL tablet Place 0.4 mg under the tongue every 5 (five) minutes x 3 doses as needed. For chest pain   Yes [provider]  Omega-3 Fatty Acids (FISH OIL) 1000 MG CAPS Take 1 capsule by mouth daily.   Yes [provider]  omeprazole (PRILOSEC) 20 MG capsule Take 20 mg by mouth daily. 02/24/15  Yes [provider]  oxybutynin (DITROPAN-XL) 10 MG 24 hr tablet Take 10 mg by mouth at bedtime.   Yes [provider]  TOUJEO SOLOSTAR 300 UNIT/ML SOPN INJECT 10  UNITS SUBQ AT BEDTIME 09/24/16  Yes [provider]  traMADol (ULTRAM) 50 MG tablet Take 1 tablet (50 mg total) by mouth every 6 (six) hours as needed. 09/29/16  Yes Long, Arlyss RepressJoshua G, MD  cephALEXin (KEFLEX) 500 MG capsule Take 1 capsule (500 mg total) by mouth 2 (two) times daily. 04/28/19   Jennine Peddy, Waylan BogaAlexandra M, PA-C  mineral oil enema Place 1 enema rectally once.    [provider]  pantoprazole (PROTONIX) 20 MG tablet Take 1 tablet (20 mg total) by mouth daily. Patient not taking: Reported on 04/28/2019 07/22/18   Linwood DibblesKnapp, Jon, MD    Family History No family history on file.  Social History Social History   Tobacco Use  . Smoking status: Never Smoker  . Smokeless tobacco: Never Used  Substance Use Topics  . Alcohol use: No  . Drug use: No     Allergies   Aspirin, Bee venom, and Penicillins   Review of Systems Review of Systems  Constitutional: Negative for chills and fever.  HENT: Negative for facial swelling and sore throat.   Respiratory: Negative for shortness of breath.   Cardiovascular: Negative for chest pain.  Gastrointestinal: Positive for nausea. Negative for abdominal pain and vomiting.  Genitourinary: Positive for frequency. Negative for dysuria.  Musculoskeletal: Negative for back pain.  Skin: Negative for rash and wound.  Neurological: Negative for headaches.  Psychiatric/Behavioral: The patient is not nervous/anxious.      Physical Exam Updated Vital Signs BP 137/76 (BP Location: Left Arm)   Pulse (!) 109   Temp 100 F (37.8 C) (Oral)   Resp 20   SpO2 96%   Physical Exam Vitals signs and nursing note reviewed.  Constitutional:      General: She is not in acute distress.    Appearance: She is well-developed. She is not diaphoretic.  HENT:     Head: Normocephalic and atraumatic.     Mouth/Throat:     Mouth: Mucous membranes are dry.     Pharynx: No oropharyngeal exudate.  Eyes:     General: No scleral icterus.       Right eye: No  discharge.        Left eye: No discharge.     Conjunctiva/sclera: Conjunctivae normal.     Pupils: Pupils are equal, round, and reactive to light.  Neck:     Musculoskeletal: Normal range of motion and neck supple.     Thyroid: No thyromegaly.  Cardiovascular:     Rate and Rhythm: Normal rate and regular rhythm.  Heart sounds: Normal heart sounds. No murmur. No friction rub. No gallop.   Pulmonary:     Effort: Pulmonary effort is normal. No respiratory distress.     Breath sounds: Normal breath sounds. No stridor. No wheezing or rales.  Abdominal:     General: Bowel sounds are normal. There is no distension.     Palpations: Abdomen is soft.     Tenderness: There is no abdominal tenderness. There is no right CVA tenderness, left CVA tenderness, guarding or rebound.  Lymphadenopathy:     Cervical: No cervical adenopathy.  Skin:    General: Skin is warm and dry.     Coloration: Skin is not pale.     Findings: No rash.  Neurological:     Mental Status: She is alert.     Coordination: Coordination normal.      ED Treatments / Results  Labs (all labs ordered are listed, but only abnormal results are displayed) Labs Reviewed  BASIC METABOLIC PANEL - Abnormal; Notable for the following components:      Result Value   Sodium 132 (*)    Chloride 92 (*)    Glucose, Bld 339 (*)    Creatinine, Ser 1.12 (*)    GFR calc non Af Amer 52 (*)    All other components within normal limits  CBC - Abnormal; Notable for the following components:   Hemoglobin 11.7 (*)    All other components within normal limits  URINALYSIS, ROUTINE W REFLEX MICROSCOPIC - Abnormal; Notable for the following components:   APPearance HAZY (*)    Glucose, UA >=500 (*)    Hgb urine dipstick SMALL (*)    Ketones, ur 5 (*)    Protein, ur 30 (*)    Leukocytes,Ua SMALL (*)    WBC, UA >50 (*)    Bacteria, UA RARE (*)    All other components within normal limits  CBG MONITORING, ED - Abnormal; Notable for the  following components:   Glucose-Capillary 324 (*)    All other components within normal limits  CBG MONITORING, ED - Abnormal; Notable for the following components:   Glucose-Capillary 278 (*)    All other components within normal limits  CBG MONITORING, ED - Abnormal; Notable for the following components:   Glucose-Capillary 228 (*)    All other components within normal limits  URINE CULTURE  LACTIC ACID, PLASMA  LACTIC ACID, PLASMA  CBG MONITORING, ED    EKG None  Radiology No results found.  Procedures Procedures (including critical care time)  Medications Ordered in ED Medications  acetaminophen (TYLENOL) tablet 650 mg (has no administration in time range)  sodium chloride 0.9 % bolus 1,000 mL (0 mLs Intravenous Stopped 04/28/19 1556)  insulin aspart (novoLOG) injection 10 Units (10 Units Subcutaneous Given 04/28/19 1420)  sodium chloride 0.9 % bolus 500 mL (0 mLs Intravenous Stopped 04/28/19 1714)  cefTRIAXone (ROCEPHIN) 1 g in sodium chloride 0.9 % 100 mL IVPB (0 g Intravenous Stopped 04/28/19 1714)  carvedilol (COREG) tablet 6.25 mg (6.25 mg Oral Given 04/28/19 1838)     Initial Impression / Assessment and Plan / ED Course  I have reviewed the triage vital signs and the nursing notes.  Pertinent labs & imaging results that were available during my care of the patient were reviewed by me and considered in my medical decision making (see chart for details).        Patient presenting with complaints of hyperglycemia and urinary frequency.  Patient is hyperglycemic,  however no signs of diabetic emergency.  Patient is somewhat dehydrated, given 1500 L fluid bolus.  She is also found to have UTI.  Urine culture sent.  IV Rocephin given in ED.  We will change Macrobid to Keflex.   Good hydration discussed.  Prior to discharge, patient vitals were checked Patient was still tachycardic to 117.  Patient had not had her evening dose of Coreg.  This was given and lactic acid was  drawn, which was within normal limits.  Patient also found to have 100 temperature, given Tylenol.  Low suspicion of sepsis and feel patient can be discharged home with antibiotics with strict return precautions.Follow-up to PCP for further glucose control as well, as her sugars may just be are elevated due to her UTI.   Return precautions discussed.  Patient understands and agrees with plan.  Patient vitals stable throughout ED course and discharged in satisfactory condition.  Patient also evaluated by my attending, Dr. Jacqulyn BathLong, who guided the patient's management and agrees with plan.  Final Clinical Impressions(s) / ED Diagnoses   Final diagnoses:  Lower urinary tract infectious disease    ED Discharge Orders         Ordered    cephALEXin (KEFLEX) 500 MG capsule  2 times daily     04/28/19 [id number]               Taia Bramlett M, Cordelia Poche-C 04/28/19 2003    Long, Joshua G, MD 04/29/19 [phone]

## 2019-04-28 NOTE — Discharge Instructions (Signed)
Take Keflex until completed for your urinary tract infection.  Make sure to drink plenty of water and stay hydrated.  A urine culture will be sent and you will be called if there needs to be a change in your antibiotic.  Please call your doctor to make them aware of your elevated blood sugars and your urinary tract infection.  They may want to change your diabetes medication doses temporarily or wait until your urinary tract infection has cleared.  Please return to the emergency department if you develop any new or worsening symptoms including fever of 100.4, severe back pain, intractable vomiting, or any other new or concerning symptoms.

## 2019-04-28 NOTE — ED Triage Notes (Signed)
Patient reports symptoms of burning and urinary frequency that started on Sunday night. Afebrile. Patient states her blood sugar has also been running high.

## 2019-04-29 LAB — URINE CULTURE: Culture: <10000 — AB

## 2024-03-20 ENCOUNTER — Emergency Department (HOSPITAL_COMMUNITY)
Admission: EM | Admit: 2024-03-20 | Discharge: 2024-03-20 | Disposition: A | Discharge status: Home / Self Care | Attending: Emergency Medicine | Admitting: Emergency Medicine

## 2024-03-20 ENCOUNTER — Encounter (HOSPITAL_COMMUNITY): Payer: Self-pay | Admitting: *Deleted

## 2024-03-20 ENCOUNTER — Other Ambulatory Visit: Payer: Self-pay

## 2024-03-20 ENCOUNTER — Emergency Department (HOSPITAL_COMMUNITY)

## 2024-03-20 DIAGNOSIS — I10 Essential (primary) hypertension: Secondary | ICD-10-CM | POA: Insufficient documentation

## 2024-03-20 DIAGNOSIS — M26621 Arthralgia of right temporomandibular joint: Secondary | ICD-10-CM | POA: Diagnosis not present

## 2024-03-20 DIAGNOSIS — Z7984 Long term (current) use of oral hypoglycemic drugs: Secondary | ICD-10-CM | POA: Insufficient documentation

## 2024-03-20 DIAGNOSIS — E119 Type 2 diabetes mellitus without complications: Secondary | ICD-10-CM | POA: Diagnosis not present

## 2024-03-20 DIAGNOSIS — Z79899 Other long term (current) drug therapy: Secondary | ICD-10-CM | POA: Diagnosis not present

## 2024-03-20 DIAGNOSIS — R220 Localized swelling, mass and lump, head: Secondary | ICD-10-CM | POA: Diagnosis present

## 2024-03-20 LAB — CBC WITH DIFFERENTIAL/PLATELET
Abs Immature Granulocytes: 0.02 10*3/uL (ref 0.00–0.07)
Basophils Absolute: 0 10*3/uL (ref 0.0–0.1)
Basophils Relative: 0 %
Eosinophils Absolute: 0.2 10*3/uL (ref 0.0–0.5)
Eosinophils Relative: 3 %
HCT: 35.6 % — ABNORMAL LOW (ref 36.0–46.0)
Hemoglobin: 11.2 g/dL — ABNORMAL LOW (ref 12.0–15.0)
Immature Granulocytes: 0 %
Lymphocytes Relative: 20 %
Lymphs Abs: 1.4 10*3/uL (ref 0.7–4.0)
MCH: 28.6 pg (ref 26.0–34.0)
MCHC: 31.5 g/dL (ref 30.0–36.0)
MCV: 91 fL (ref 80.0–100.0)
Monocytes Absolute: 0.5 10*3/uL (ref 0.1–1.0)
Monocytes Relative: 7 %
Neutro Abs: 5 10*3/uL (ref 1.7–7.7)
Neutrophils Relative %: 70 %
Platelets: 327 10*3/uL (ref 150–400)
RBC: 3.91 MIL/uL (ref 3.87–5.11)
RDW: 12.6 % (ref 11.5–15.5)
WBC: 7.1 10*3/uL (ref 4.0–10.5)
nRBC: 0 % (ref 0.0–0.2)

## 2024-03-20 LAB — BASIC METABOLIC PANEL WITH GFR
Anion gap: 10 (ref 5–15)
BUN: 14 mg/dL (ref 8–23)
CO2: 27 mmol/L (ref 22–32)
Calcium: 9.4 mg/dL (ref 8.9–10.3)
Chloride: 99 mmol/L (ref 98–111)
Creatinine, Ser: 1.04 mg/dL — ABNORMAL HIGH (ref 0.44–1.00)
GFR, Estimated: 59 mL/min — ABNORMAL LOW (ref >60)
Glucose, Bld: 178 mg/dL — ABNORMAL HIGH (ref 70–99)
Potassium: 3.9 mmol/L (ref 3.5–5.1)
Sodium: 136 mmol/L (ref 135–145)

## 2024-03-20 MED ORDER — IOHEXOL 300 MG/ML  SOLN
75.0000 mL | Freq: Once | INTRAMUSCULAR | Status: AC | PRN
  Administered 2024-03-20: 75 mL via INTRAVENOUS

## 2024-03-20 MED ORDER — KETOROLAC TROMETHAMINE 30 MG/ML IJ SOLN
30.0000 mg | Freq: Once | INTRAMUSCULAR | Status: AC
  Administered 2024-03-20: 30 mg via INTRAVENOUS
  Filled 2024-03-20: qty 1

## 2024-03-20 MED ORDER — NAPROXEN 500 MG PO TABS: 500.0000 mg | ORAL_TABLET | Freq: Two times a day (BID) | ORAL | 0 refills | Status: AC

## 2024-03-20 NOTE — ED Provider Notes (Signed)
 [facility] AT [facility] Provider Note   CSN: [id number] Arrival date & time: 03/20/24  9147     Patient presents with: Facial Swelling   Pamela [name] is a 68 y.o. female.   HPI   This patient is a 68 year old female, she has hypertension on amlodipine, carvedilol  hydrochlorothiazide and lisinopril, she is a diabetic on Toujeo, she presents to the hospital today with a complaint of facial pain and swelling on the right side, she states that it started yesterday, it has been persistent, it is around her right temporomandibular joint and extends on the jaw and onto her neck.  She has pain with opening and closing her mouth.  She denies any swelling of her gingiva or dental pain, she has no fevers or chills no shortness of breath or chest pain, no difficulty moving her head from side-to-side with rotation flexion or extension.  She denies injuries, denies breaking any teeth, states this started spontaneously, it did not involve pain when she opened her closed her mouth initially, there was no pop snap or crack felt.  Prior to Admission medications   Medication Sig Start Date End Date Taking? Authorizing Provider  naproxen (NAPROSYN) 500 MG tablet Take 1 tablet (500 mg total) by mouth 2 (two) times daily with a meal. 03/20/24  Yes Early Glisson, MD  amLODipine (NORVASC) 2.5 MG tablet Take 2.5 mg by mouth daily. 11/13/15   [provider]  atorvastatin (LIPITOR) 40 MG tablet Take 40 mg by mouth daily.    [provider]  BABY ASPIRIN PO Take 1 tablet by mouth daily. 03/06/18   [provider]  carvedilol  (COREG ) 6.25 MG tablet Take 6.25 mg by mouth 2 (two) times daily with a meal.    [provider]  cephALEXin  (KEFLEX ) 500 MG capsule Take 1 capsule (500 mg total) by mouth 2 (two) times daily. 04/28/19   Law, Alexandra M, PA-C  cetirizine (ZYRTEC) 10 MG tablet Take 10 mg by mouth daily.    [provider]  Cholecalciferol  (VITAMIN D3) 2000 units TABS Take 1 capsule by mouth daily.    [provider]  gabapentin (NEURONTIN) 300 MG capsule Take 300-600 mg by mouth daily as needed (for pain).     [provider]  hydrochlorothiazide (HYDRODIURIL) 25 MG tablet Take 25 mg by mouth every morning.    [provider]  ibuprofen (ADVIL,MOTRIN) 200 MG tablet Take 400 mg by mouth every 6 (six) hours as needed for fever or moderate pain.    [provider]  linaclotide (LINZESS) 145 MCG CAPS capsule Take 145 mcg by mouth daily.    [provider]  Liraglutide (VICTOZA) 18 MG/3ML SOPN Inject 1.8 mg into the skin daily.    [provider]  lisinopril (PRINIVIL,ZESTRIL) 20 MG tablet Take 20 mg by mouth daily. 02/14/15   [provider]  metFORMIN (GLUCOPHAGE) 500 MG tablet Take 500 mg by mouth 2 (two) times daily with a meal.    [provider]  mineral oil enema Place 1 enema rectally once.    [provider]  Multiple Vitamin (MULTIVITAMIN WITH MINERALS) TABS tablet Take 1 tablet by mouth daily.    [provider]  nitrofurantoin  (MACRODANTIN ) 100 MG capsule Take 100 mg by mouth 2 (two) times a day.    [provider]  nitroGLYCERIN (NITROSTAT) 0.4 MG SL tablet Place 0.4 mg under the tongue every 5 (five) minutes x 3 doses as needed.  For chest pain    [provider]  Omega-3 Fatty Acids (FISH OIL) 1000 MG CAPS Take 1 capsule by mouth daily.    [provider]  omeprazole (PRILOSEC) 20 MG capsule Take 20 mg by mouth daily. 02/24/15   [provider]  oxybutynin (DITROPAN-XL) 10 MG 24 hr tablet Take 10 mg by mouth at bedtime.    [provider]  pantoprazole  (PROTONIX ) 20 MG tablet Take 1 tablet (20 mg total) by mouth daily. Patient not taking: Reported on 04/28/2019 07/22/18   Trish Furl, MD  TOUJEO SOLOSTAR 300 UNIT/ML SOPN INJECT 10 UNITS SUBQ AT BEDTIME 09/24/16   [provider]   traMADol  (ULTRAM ) 50 MG tablet Take 1 tablet (50 mg total) by mouth every 6 (six) hours as needed. 09/29/16   Long, Shereen Dike, MD    Allergies: Aspirin, Bee venom, and Penicillins    Review of Systems  All other systems reviewed and are negative.   Updated Vital Signs BP (!) 178/96   Pulse 76   Temp 98.7 F (37.1 C) (Oral)   Resp (!) 22   Ht 1.626 m (5' 4)   Wt 100.7 kg   SpO2 92%   BMI 38.11 kg/m   Physical Exam Vitals and nursing note reviewed.  Constitutional:      General: She is not in acute distress.    Appearance: She is well-developed.  HENT:     Head: Normocephalic and atraumatic.     Right Ear: Tympanic membrane, ear canal and external ear normal. There is no impacted cerumen.     Left Ear: Tympanic membrane and external ear normal. There is no impacted cerumen.     Nose: Nose normal.     Mouth/Throat:     Mouth: Mucous membranes are moist.     Pharynx: No oropharyngeal exudate.     Comments: Very few teeth left, gingiva appear to be intact without obvious swelling or tenderness.  There is no difficulty with moving her tongue around in her mouth and there is no tenderness in the submandibular or sublingual area.  There is exquisite tenderness over the right temporomandibular joint and the right maxillofacial structures including around the zygomatic arch and down onto the mandible but the majority of the pain is around the TMJ on the right.  She does have difficulty opening her mouth because of this pain.  There is no visible swelling externally  Eyes:     General: No scleral icterus.       Right eye: No discharge.        Left eye: No discharge.     Conjunctiva/sclera: Conjunctivae normal.     Pupils: Pupils are equal, round, and reactive to light.   Neck:     Thyroid : No thyromegaly.     Vascular: No JVD.   Cardiovascular:     Rate and Rhythm: Normal rate and regular rhythm.     Heart sounds: Normal heart sounds. No murmur heard.    No friction rub. No  gallop.  Pulmonary:     Effort: Pulmonary effort is normal. No respiratory distress.     Breath sounds: Normal breath sounds. No wheezing or rales.  Abdominal:     General: Bowel sounds are normal. There is no distension.     Palpations: Abdomen is soft. There is no mass.     Tenderness: There is no abdominal tenderness.   Musculoskeletal:        General: No tenderness. Normal range of  motion.     Cervical back: Normal range of motion and neck supple.  Lymphadenopathy:     Cervical: No cervical adenopathy.   Skin:    General: Skin is warm and dry.     Findings: No erythema or rash.   Neurological:     Mental Status: She is alert.     Coordination: Coordination normal.   Psychiatric:        Behavior: Behavior normal.     (all labs ordered are listed, but only abnormal results are displayed) Labs Reviewed  CBC WITH DIFFERENTIAL/PLATELET - Abnormal; Notable for the following components:      Result Value   Hemoglobin 11.2 (*)    HCT 35.6 (*)    All other components within normal limits  BASIC METABOLIC PANEL WITH GFR - Abnormal; Notable for the following components:   Glucose, Bld 178 (*)    Creatinine, Ser 1.04 (*)    GFR, Estimated 59 (*)    All other components within normal limits    EKG: None  Radiology: CT Soft Tissue Neck W Contrast Result Date: 03/20/2024 EXAM: CT NECK WITH CONTRAST 03/20/2024 08:45:13 AM TECHNIQUE: CT of the neck was performed with the administration of intravenous contrast. Multiplanar reformatted images are provided for review. Automated exposure control, iterative reconstruction, and/or weight based adjustment of the mA/kV was utilized to reduce the radiation dose to as low as reasonably achievable. COMPARISON: CT of the neck with contrast dated 11/29/2015. CLINICAL HISTORY: R sided facial/jaw pain and swelling, and associated headache. Onset yesterday. Gradually progressively worse. Airway patent. Denies fever, nausea, vomiting, chest pain,  cough, shortness of breath, shoulder pain, visual or hearing changes or other symptoms. Swelling noted to right face/jaw/neck. Tender to palpation. Guarding oral activity. Alert, no acute distress, calm interactive. FINDINGS: AERODIGESTIVE TRACT: No discrete mass. No edema. SALIVARY GLANDS: No significant findings. THYROID : Previously seen cystic lesion at the upper pole of the right lobe of the thyroid  is markedly diminished in size now measuring 17 mm maximally. Recommend no follow-up imaging. A smaller cystic nodule is present inferiorly in the right lobe of the thyroid  measuring 11 mm. Recommend no imaging follow-up. LYMPH NODES: No suspicious cervical lymphadenopathy. SOFT TISSUES: No mass or fluid collection. No significant submandibular or sublingual abnormality. BRAIN, ORBITS, SINUSES AND MASTOIDS: No acute abnormality. LUNGS AND MEDIASTINUM: No acute abnormality. BONES: No focal bone abnormality. VASCULATURE: Atherosclerotic calcifications are again noted at the aortic arch, great vessel origins, and at the carotid bifurcations without significant stenosis. IMPRESSION: 1. No acute findings or discrete mass in the neck. 2. Markedly diminished cystic lesion at the upper pole of the right lobe of the thyroid , now measuring 17 mm maximally. No follow-up imaging recommended. Electronically signed by: Audree Leas MD 03/20/2024 09:04 AM EDT RP Workstation: [id number]     Procedures   Medications Ordered in the ED  ketorolac  (TORADOL ) 30 MG/ML injection 30 mg (30 mg Intravenous Given 03/20/24 0741)  iohexol  (OMNIPAQUE ) 300 MG/ML solution 75 mL (75 mLs Intravenous Contrast Given 03/20/24 0827)                                    Medical Decision Making Amount and/or Complexity of Data Reviewed Labs: ordered. Radiology: ordered.  Risk Prescription drug management.    This patient presents to the ED for concern of pain in the face differential diagnosis includes TMJ syndrome, inflammation,  dental abscess, tic douloureux  Additional history obtained   Additional history obtained from Electronic Medical Record External records from outside source obtained and reviewed including medical record including prior office visits, followed by her family doctor for multiple different problems including acid reflux, diabetes and hypertension, no recent admissions to the hospital  + Lab Tests:  I Ordered, and personally interpreted labs.  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  They have had opportunity to ask questions,  expressed their understanding to the need for follow-up with primary care physician        Final diagnoses:  Arthralgia of right temporomandibular joint    ED Discharge Orders          Ordered    naproxen (NAPROSYN) 500 MG tablet  2 times daily with meals        03/20/24 0911               Early Glisson, MD 03/20/24 [phone]

## 2024-03-20 NOTE — Discharge Instructions (Addendum)
 The CT scan does not show any specific abnormal findings.  There is no obvious abscess or infection, the joint of the jaw is inflamed and for this she will need to take an anti-inflammatory.  I have prescribed the following medication to help  Please apply cold compresses wrapped in a towel on and off for no more than 5 minutes at a time to help with pain and inflammation.  Please take Naprosyn, 500mg  by mouth twice daily as needed for pain - this in an antiinflammatory medicine (NSAID) and is similar to ibuprofen - many people feel that it is stronger than ibuprofen and it is easier to take since it is a smaller pill.  Please use this only for 1 week - if your pain persists, you will need to follow up with your doctor in the office for ongoing guidance and pain control.  You can follow-up with your dentist or your family doctor but I would strongly recommend your dentist within 1 week  ER for severe or worsening symptoms

## 2024-03-20 NOTE — ED Triage Notes (Addendum)
 BIB family from home for R sided facial/ jaw pain and swelling, and associated HA. Onset yesterday. Gradually progressively worse. Airway patent. Denies fever, NV, CP, cough, sob, shoulder pain, visual or hearing changes or other sx. Swelling noted to R face/jaw/neck. TTP. Guarding oral activity. Alert, NAD, calm interactive. EDP present at [facility].

## 2024-03-20 NOTE — ED Notes (Signed)
 Patient transported to CT
